# Patient Record
Sex: Male | Born: 1965 | Race: White | Hispanic: No | Marital: Married
Health system: Midwestern US, Community
[De-identification: ages and names within clinical notes are randomized; demographics above are authoritative.]

## PROBLEM LIST (undated history)

## (undated) DIAGNOSIS — I499 Cardiac arrhythmia, unspecified: Secondary | ICD-10-CM

## (undated) DIAGNOSIS — E7800 Pure hypercholesterolemia, unspecified: Secondary | ICD-10-CM

## (undated) DIAGNOSIS — M1990 Unspecified osteoarthritis, unspecified site: Secondary | ICD-10-CM

## (undated) DIAGNOSIS — I10 Essential (primary) hypertension: Secondary | ICD-10-CM

## (undated) DIAGNOSIS — R053 Chronic cough: Principal | ICD-10-CM

## (undated) DIAGNOSIS — Z6835 Body mass index (BMI) 35.0-35.9, adult: Secondary | ICD-10-CM

## (undated) DIAGNOSIS — E66812 Obesity, class 2: Principal | ICD-10-CM

## (undated) DIAGNOSIS — K581 Irritable bowel syndrome with constipation: Secondary | ICD-10-CM

## (undated) DIAGNOSIS — F4312 Post-traumatic stress disorder, chronic: Secondary | ICD-10-CM

## (undated) DIAGNOSIS — G4733 Obstructive sleep apnea (adult) (pediatric): Secondary | ICD-10-CM

## (undated) DIAGNOSIS — L309 Dermatitis, unspecified: Secondary | ICD-10-CM

## (undated) DIAGNOSIS — U071 COVID-19: Secondary | ICD-10-CM

## (undated) DIAGNOSIS — G478 Other sleep disorders: Secondary | ICD-10-CM

## (undated) DIAGNOSIS — J209 Acute bronchitis, unspecified: Principal | ICD-10-CM

## (undated) DIAGNOSIS — Z87891 Personal history of nicotine dependence: Secondary | ICD-10-CM

## (undated) DIAGNOSIS — N281 Cyst of kidney, acquired: Principal | ICD-10-CM

## (undated) DIAGNOSIS — Z716 Tobacco abuse counseling: Secondary | ICD-10-CM

## (undated) DIAGNOSIS — R110 Nausea: Secondary | ICD-10-CM

## (undated) DIAGNOSIS — Z23 Encounter for immunization: Secondary | ICD-10-CM

## (undated) DIAGNOSIS — L299 Pruritus, unspecified: Secondary | ICD-10-CM

## (undated) DIAGNOSIS — R058 Other specified cough: Secondary | ICD-10-CM

## (undated) DIAGNOSIS — J42 Unspecified chronic bronchitis: Secondary | ICD-10-CM

## (undated) HISTORY — PX: KNEE ARTHROSCOPY: SUR90

## (undated) HISTORY — PX: SHOULDER ARTHROSCOPY: SHX128

## (undated) HISTORY — PX: TONSILLECTOMY: SUR1361

---

## 2009-12-02 ENCOUNTER — Ambulatory Visit (HOSPITAL_COMMUNITY): Admission: RE | Admit: 2009-12-02 | Discharge: 2009-12-02 | Payer: Self-pay | Admitting: Orthopedic Surgery

## 2011-11-25 ENCOUNTER — Encounter (HOSPITAL_COMMUNITY): Payer: Self-pay | Admitting: Emergency Medicine

## 2011-11-25 ENCOUNTER — Emergency Department (HOSPITAL_COMMUNITY)
Admission: EM | Admit: 2011-11-25 | Discharge: 2011-11-25 | Disposition: A | Discharge status: Home / Self Care | Payer: Managed Care, Other (non HMO) | Attending: Emergency Medicine | Admitting: Emergency Medicine

## 2011-11-25 DIAGNOSIS — Z79899 Other long term (current) drug therapy: Secondary | ICD-10-CM | POA: Insufficient documentation

## 2011-11-25 DIAGNOSIS — F17200 Nicotine dependence, unspecified, uncomplicated: Secondary | ICD-10-CM | POA: Insufficient documentation

## 2011-11-25 DIAGNOSIS — R040 Epistaxis: Secondary | ICD-10-CM | POA: Insufficient documentation

## 2011-11-25 DIAGNOSIS — M129 Arthropathy, unspecified: Secondary | ICD-10-CM | POA: Insufficient documentation

## 2011-11-25 DIAGNOSIS — Z7982 Long term (current) use of aspirin: Secondary | ICD-10-CM | POA: Insufficient documentation

## 2011-11-25 DIAGNOSIS — E7800 Pure hypercholesterolemia, unspecified: Secondary | ICD-10-CM | POA: Insufficient documentation

## 2011-11-25 DIAGNOSIS — I10 Essential (primary) hypertension: Secondary | ICD-10-CM | POA: Insufficient documentation

## 2011-11-25 HISTORY — DX: Pure hypercholesterolemia, unspecified: E78.00

## 2011-11-25 HISTORY — DX: Essential (primary) hypertension: I10

## 2011-11-25 HISTORY — DX: Unspecified osteoarthritis, unspecified site: M19.90

## 2011-11-25 MED ORDER — OXYMETAZOLINE HCL 0.05 % NA SOLN
4.0000 | Freq: Once | NASAL | Status: AC
  Administered 2011-11-25: 4 via NASAL
  Filled 2011-11-25: qty 15

## 2011-11-25 NOTE — ED Provider Notes (Signed)
History     CSN: [id number]  Arrival date & time 11/25/11  1800   First MD Initiated Contact with Patient 11/25/11 1904      Chief Complaint  Patient presents with  . Epistaxis    (Consider location/radiation/quality/duration/timing/severity/associated sxs/prior treatment) HPI Comments: Phillip Daugherty is a 46 y.o. Male with left nares bleeding since yesterday. Also, has occasional bleeding from right nares, and coughing out blood. No weakness, dizziness, nausea, vomiting, fever, chills, sinus pain, or ear pain. There are no modifying factors. He is using his usual medicines, including Afrin nasal spray, without relief  Patient is a 46 y.o. male presenting with nosebleeds. The history is provided by the patient and the spouse.  Epistaxis     Past Medical History  Diagnosis Date  . Hypertension   . High cholesterol   . Arthritis     Past Surgical History  Procedure Date  . Tonsillectomy   . Shoulder arthroscopy   . Knee arthroscopy     No family history on file.  History  Substance Use Topics  . Smoking status: Current Every Day Smoker -- 1.0 packs/day    Types: Cigarettes  . Smokeless tobacco: Not on file  . Alcohol Use: No     rare      Review of Systems  HENT: Positive for nosebleeds.   All other systems reviewed and are negative.    Allergies  Review of patient's allergies indicates no known allergies.  Home Medications   Current Outpatient Rx  Name Route Sig Dispense Refill  . ASPIRIN EC 81 MG PO TBEC Oral Take 81 mg by mouth daily.    Marland Kitchen HYDROCOD POLST-CPM POLST ER 10-8 MG/5ML PO LQCR Oral Take 5 mLs by mouth 2 (two) times daily. Start 11/25/11 for 6 days    . CITALOPRAM HYDROBROMIDE 20 MG PO TABS Oral Take 20 mg by mouth daily.    Marland Kitchen FLUTICASONE PROPIONATE 50 MCG/ACT NA SUSP Nasal Place 2 sprays into the nose daily.    Marland Kitchen LEVOCETIRIZINE DIHYDROCHLORIDE 5 MG PO TABS Oral Take 5 mg by mouth daily.    Marland Kitchen LEVOFLOXACIN 500 MG PO TABS Oral Take 500 mg by  mouth daily. Start 11/25/11 for 10 days    . LISINOPRIL-HYDROCHLOROTHIAZIDE 20-12.5 MG PO TABS Oral Take 1 tablet by mouth daily.    Marland Kitchen MAGNESIUM OXIDE 400 MG PO TABS Oral Take 400 mg by mouth daily.    Marland Kitchen MONTELUKAST SODIUM 10 MG PO TABS Oral Take 10 mg by mouth at bedtime.    Marland Kitchen ONE-DAILY MULTI VITAMINS PO TABS Oral Take 1 tablet by mouth daily.    Marland Kitchen PANTOPRAZOLE SODIUM 40 MG PO TBEC Oral Take 40 mg by mouth daily.    Marland Kitchen SIMVASTATIN 40 MG PO TABS Oral Take 40 mg by mouth every evening.      BP 133/72  Pulse 104  Temp 98.6 F (37 C)  Resp 20  Ht 5\' 7"  (1.702 m)  Wt 260 lb (117.935 kg)  BMI 40.72 kg/m2  SpO2 99%  Physical Exam  Nursing note and vitals reviewed. Constitutional: He is oriented to person, place, and time. He appears well-developed and well-nourished.  HENT:  Head: Normocephalic and atraumatic.  Right Ear: External ear normal.  Left Ear: External ear normal.       Small amount of blood, left nares. No obvious active bleeding.  Eyes: Conjunctivae normal and EOM are normal. Pupils are equal, round, and reactive to light.  Neck: Normal range of motion  and phonation normal. Neck supple.  Cardiovascular: Normal rate.   Pulmonary/Chest: Effort normal. He exhibits no bony tenderness.  Abdominal: Normal appearance.  Musculoskeletal: Normal range of motion.  Neurological: He is alert and oriented to person, place, and time. He has normal strength. No cranial nerve deficit or sensory deficit. He exhibits normal muscle tone. Coordination normal.  Skin: Skin is warm, dry and intact.  Psychiatric: He has a normal mood and affect. His behavior is normal. Judgment and thought content normal.    ED Course  Procedures (including critical care time)  Emergency department treatment: Afrin nasal spray, and insertion of left nares, Rhino Rocket.  Procedure: no anesthesia. 7.5 CM posterior Rhino Rocket balloon inserted without difficulty. Balloon inflated to 11.5 cc. Patient tolerated  procedure well. No bleeding post placement of nasal balloon.  Nursing notes, applicable records and vitals reviewed.      Labs Reviewed - No data to display No results found.   1. Left-sided nosebleed       MDM  Epistaxis, cause unclear, but likely related to allergies with nasal mucosa irritation. He is hemodynamically stable. Doubt metabolic instability, serious bacterial infection or impending vascular collapse; the patient is stable for discharge.      Plan: Home Medications- usual; Home Treatments- ice to nose; Recommended follow up- ENT followup 4 days, sooner if needed    Flint Melter, MD 11/25/11 2025

## 2011-11-25 NOTE — ED Notes (Signed)
Pt states he does not currently take aspirin or any type of blood thinners, discussed measuring blood pressures and blood pressure medications.

## 2011-11-25 NOTE — ED Notes (Signed)
Epistasis appears controlled, pt states he no longer feels liquid running down his throat and is not coughing up any blood or clots.  Pt instructed to return to the ER if the bleeding returns.

## 2011-11-25 NOTE — ED Notes (Signed)
Patient states he has been having a left sided nose bleed since yesterday.  States he can taste blood in his mouth and has coughed up several clots.  States he was seen by a physician today and was told to come to the ER to be evaluated.  Pt states he has a history of hypertension, however is controlled with medications.

## 2011-11-25 NOTE — ED Notes (Addendum)
Pt reports intermittent nosebleed since yesterday morning. Pt states bleeding is from left nare and it has been running down throat, therefore causing him to cough up large clots of blood at times. Pt was seen at [facility] in [place] earlier today and dx with bronchitis but was told they could do nothing about the nosebleed. Pt has been packing his nose with cotton balls saturated in Afrin with no relief.

## 2011-11-25 NOTE — ED Notes (Signed)
Pt c/o intermittent nosebleed with clots since yesterday am. Denies dizziness. Pt seen at [facility] earlier today and dx with bronchitis and placed antibiotics.

## 2011-11-27 ENCOUNTER — Encounter (HOSPITAL_COMMUNITY): Payer: Self-pay | Admitting: Emergency Medicine

## 2011-11-27 ENCOUNTER — Emergency Department (HOSPITAL_COMMUNITY)
Admission: EM | Admit: 2011-11-27 | Discharge: 2011-11-27 | Disposition: A | Discharge status: Home / Self Care | Payer: Managed Care, Other (non HMO) | Attending: Emergency Medicine | Admitting: Emergency Medicine

## 2011-11-27 DIAGNOSIS — E7800 Pure hypercholesterolemia, unspecified: Secondary | ICD-10-CM | POA: Insufficient documentation

## 2011-11-27 DIAGNOSIS — R040 Epistaxis: Secondary | ICD-10-CM

## 2011-11-27 DIAGNOSIS — Z79899 Other long term (current) drug therapy: Secondary | ICD-10-CM | POA: Insufficient documentation

## 2011-11-27 DIAGNOSIS — F17200 Nicotine dependence, unspecified, uncomplicated: Secondary | ICD-10-CM | POA: Insufficient documentation

## 2011-11-27 DIAGNOSIS — Z7982 Long term (current) use of aspirin: Secondary | ICD-10-CM | POA: Insufficient documentation

## 2011-11-27 DIAGNOSIS — Z8739 Personal history of other diseases of the musculoskeletal system and connective tissue: Secondary | ICD-10-CM | POA: Insufficient documentation

## 2011-11-27 DIAGNOSIS — I10 Essential (primary) hypertension: Secondary | ICD-10-CM | POA: Insufficient documentation

## 2011-11-27 NOTE — ED Notes (Signed)
Pt reports had rhino rocket placed SUnday.  Reports woke up this morning and it had slid back further in his nose causing him to gag.  Notified Dr. Adriana Simas.  OK per Dr. Adriana Simas to deflate and reposition the rhino rocket.  Pt says is much more comfortable.

## 2011-11-27 NOTE — ED Notes (Signed)
Pt was seen here on Sunday for a nose bleed. Has a rapid rhino in place to the L nare and he states he feels like it has backed up ant it is now gagging him. Pt denies any bleeding. Front/inflation area is still in place and taped to face.

## 2011-11-29 ENCOUNTER — Ambulatory Visit (INDEPENDENT_AMBULATORY_CARE_PROVIDER_SITE_OTHER): Payer: Managed Care, Other (non HMO) | Admitting: Otolaryngology

## 2011-11-29 DIAGNOSIS — R040 Epistaxis: Secondary | ICD-10-CM

## 2011-12-04 NOTE — ED Provider Notes (Signed)
History     CSN: [id number]  Arrival date & time 11/27/11  1027   First MD Initiated Contact with Patient 11/27/11 0745      Chief Complaint  Patient presents with  . Follow-up  . Epistaxis    (Consider location/radiation/quality/duration/timing/severity/associated sxs/prior treatment) HPI....status post insertion of rapid Rhino past Sunday for left-sided nosebleed.  Patient feels it is too far back in his nose and is gagging him.  No airway problems. Severity is mild to moderate. Symptoms started today   Past Medical History  Diagnosis Date  . Hypertension   . High cholesterol   . Arthritis     Past Surgical History  Procedure Date  . Tonsillectomy   . Shoulder arthroscopy   . Knee arthroscopy     History reviewed. No pertinent family history.  History  Substance Use Topics  . Smoking status: Current Every Day Smoker -- 1.0 packs/day    Types: Cigarettes  . Smokeless tobacco: Not on file  . Alcohol Use: No     Comment: rare      Review of Systems  All other systems reviewed and are negative.    Allergies  Review of patient's allergies indicates no known allergies.  Home Medications   Current Outpatient Rx  Name  Route  Sig  Dispense  Refill  . ASPIRIN EC 81 MG PO TBEC   Oral   Take 81 mg by mouth daily.         Marland Kitchen HYDROCOD POLST-CPM POLST ER 10-8 MG/5ML PO LQCR   Oral   Take 5 mLs by mouth 2 (two) times daily. Start 11/25/11 for 6 days         . CITALOPRAM HYDROBROMIDE 20 MG PO TABS   Oral   Take 20 mg by mouth daily.         Marland Kitchen FLUTICASONE PROPIONATE 50 MCG/ACT NA SUSP   Nasal   Place 2 sprays into the nose daily.         Marland Kitchen LEVOCETIRIZINE DIHYDROCHLORIDE 5 MG PO TABS   Oral   Take 5 mg by mouth daily.         Marland Kitchen LEVOFLOXACIN 500 MG PO TABS   Oral   Take 500 mg by mouth daily. Start 11/25/11 for 10 days         . LISINOPRIL-HYDROCHLOROTHIAZIDE 20-12.5 MG PO TABS   Oral   Take 1 tablet by mouth daily.         Marland Kitchen MAGNESIUM  OXIDE 400 MG PO TABS   Oral   Take 400 mg by mouth daily.         Marland Kitchen MONTELUKAST SODIUM 10 MG PO TABS   Oral   Take 10 mg by mouth at bedtime.         Marland Kitchen ONE-DAILY MULTI VITAMINS PO TABS   Oral   Take 1 tablet by mouth daily.         Marland Kitchen PANTOPRAZOLE SODIUM 40 MG PO TBEC   Oral   Take 40 mg by mouth daily.         Marland Kitchen SIMVASTATIN 40 MG PO TABS   Oral   Take 40 mg by mouth every evening.           BP 127/93  Pulse 85  Temp 98.5 F (36.9 C) (Oral)  Resp 18  Ht 5\' 7"  (1.702 m)  Wt 260 lb (117.935 kg)  BMI 40.72 kg/m2  SpO2 100%  Physical Exam  Constitutional: He is oriented to  person, place, and time. He appears well-developed and well-nourished.  HENT:  Head: Normocephalic and atraumatic.       Rapid Rhino deflated and repositioned anteriorly.  Balloon then reinflated.  Eyes: Conjunctivae normal are normal.  Neck: Normal range of motion. Neck supple.  Musculoskeletal: Normal range of motion.  Neurological: He is alert and oriented to person, place, and time.  Skin: Skin is warm and dry.  Psychiatric: He has a normal mood and affect.    ED Course  Procedures (including critical care time)  Labs Reviewed - No data to display No results found.   1. Epistaxis       MDM  Patient feels much better after repositioning of the nasal packing. No airway compromise. Will followup with otolaryngologist        Donnetta Hutching, MD 12/04/11 1819

## 2012-01-25 IMAGING — CR DG ORBITS FOR FOREIGN BODY
2 series · 2 of 2 positions shown · non-contrast
Comparison: None.

CLINICAL DATA: Pre MRI.  History of working with metal.

ORBITS FOR FOREIGN BODY - 2 VIEW

[w waters (1 of 2)]
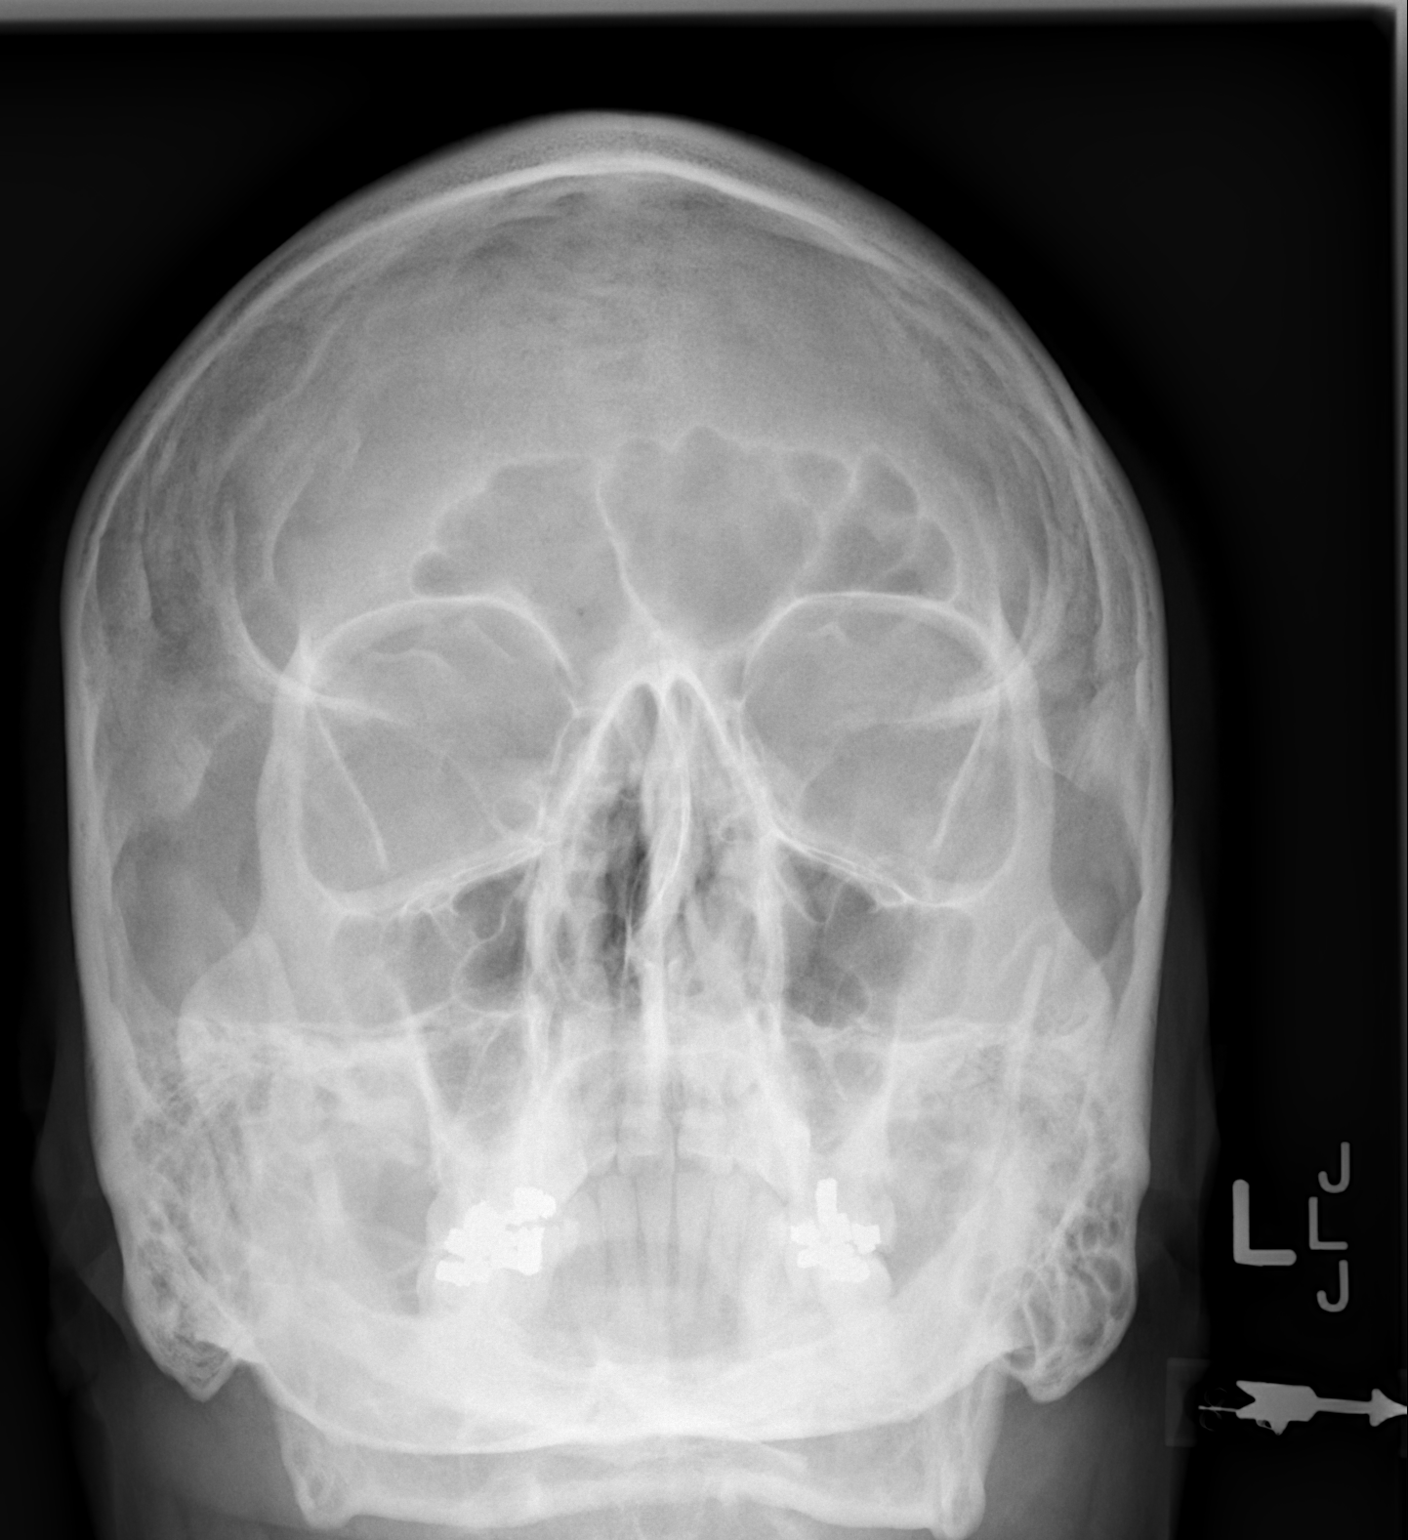

[w waters (2 of 2)]
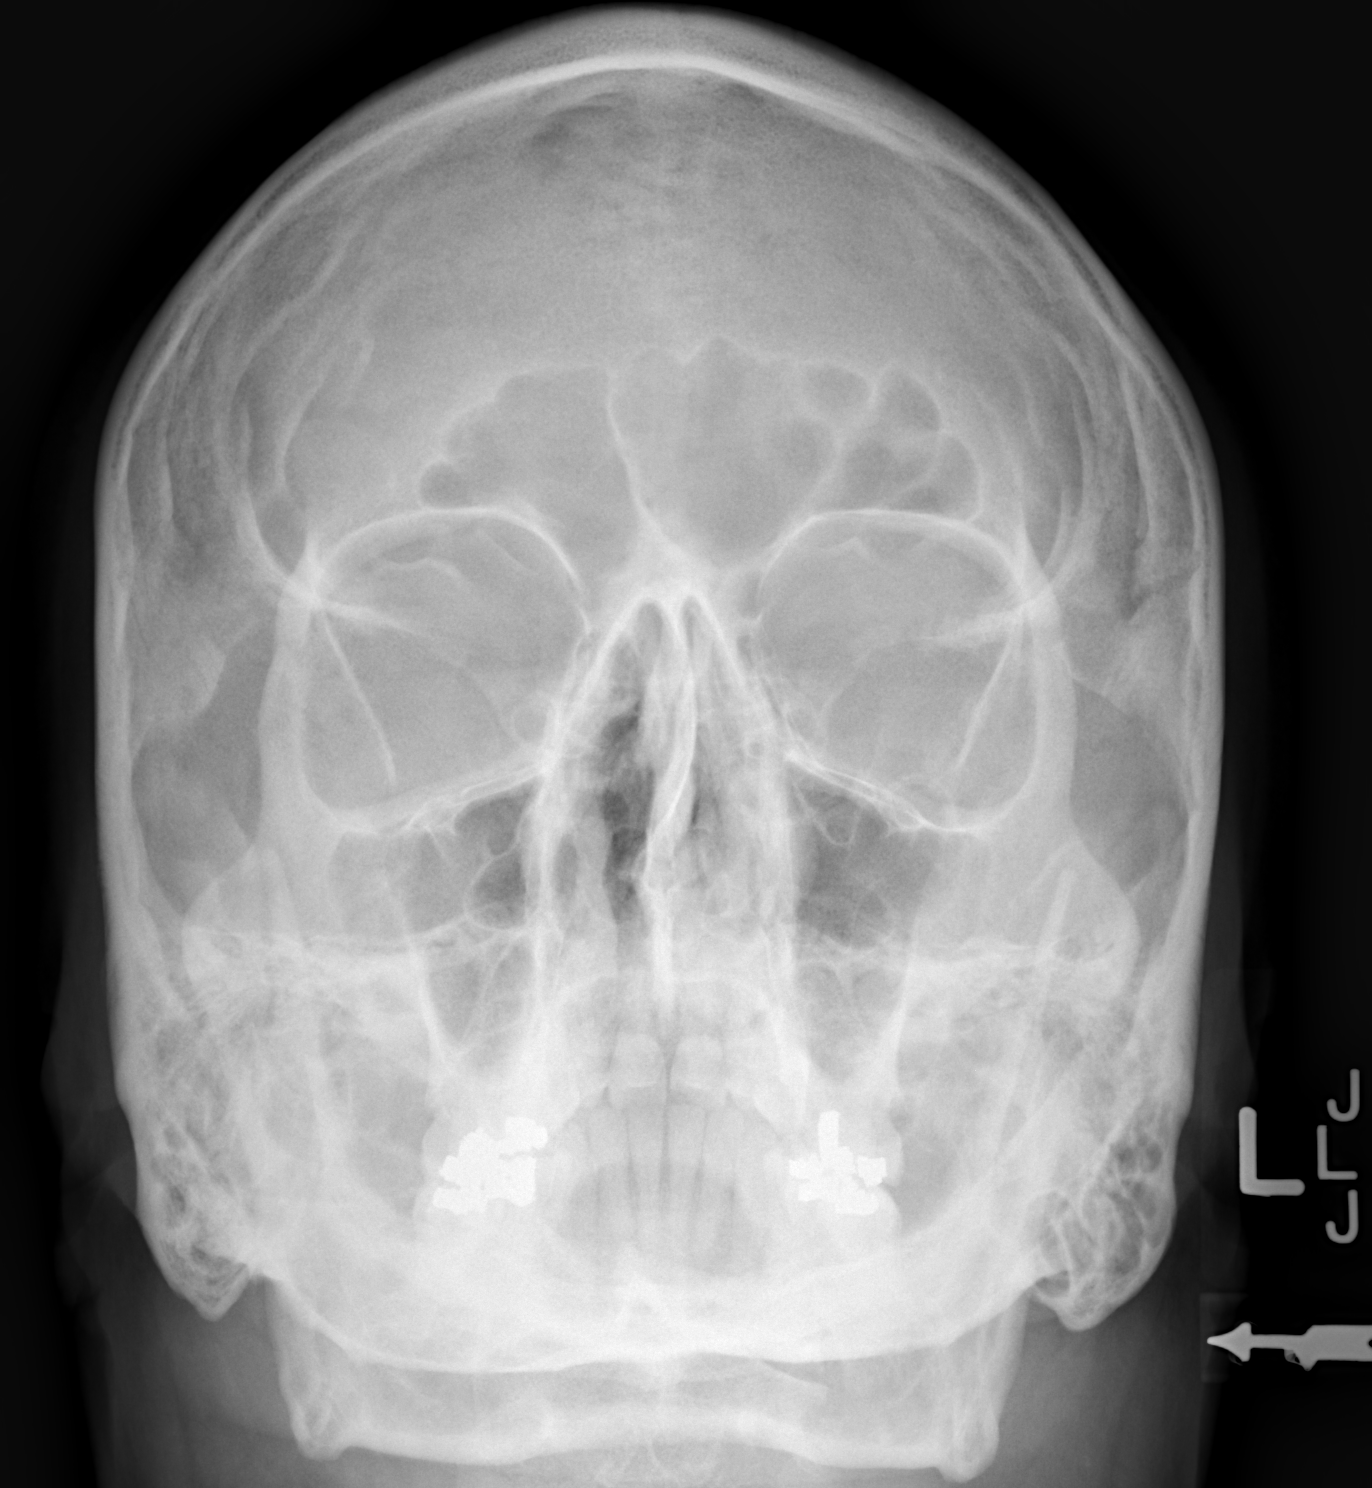

[2 of 2 positions shown; findings below may reference images not displayed]

FINDINGS: Two views of the orbits demonstrate no radiopaque foreign
bodies.  The visualized paranasal sinuses appear clear.  There may
be some fluid in the right mastoid air cells.
IMPRESSION: 1.  No radiopaque foreign body projects over either orbit.
2.  Question fluid within the right mastoid air cells.

## 2012-12-19 ENCOUNTER — Encounter (HOSPITAL_COMMUNITY): Payer: Self-pay | Admitting: Pharmacy Technician

## 2012-12-21 NOTE — H&P (Signed)
  NTS SOAP Note  Vital Signs:  Vitals as of: 12/18/2012: Systolic 130: Diastolic 79: Heart Rate 86: Temp 98.75F: Height 51ft 7in: Weight 238Lbs 0 Ounces: Pain Level 3: BMI 37.28  BMI : 37.28 kg/m2  Subjective: This 47 Years 78 Months old Male presents for of    HERNIA: ,Has had an enlarging umbilical hernia over the past few months.  Has started having pain with straining, having to reduce it himself.  Review of Symptoms:  Constitutional:unremarkable   Head:unremarkable    Eyes:  pain bilateral Nose/Mouth/Throat:unremarkable Cardiovascular:  unremarkable   Respiratory:unremarkable   Gastrointestin    abdominal pain Genitourinary:unremarkable       joint and back Skin:unremarkable Hematolgic/Lymphatic:unremarkable     Allergic/Immunologic:unremarkable     Past Medical History:    Reviewed   Past Medical History  Surgical History: left shoulder repair, left knee surgery Medical Problems: HTN, reflux, extrinsic asthma Allergies: nkda Medications: lisinopril, simvastatin, xyzal, protonix, lortab, singulair, soma, baby asa, aleve prn   Social History:Reviewed  Social History  Preferred Language: English Race:  White Ethnicity: Not Hispanic / Latino Age: 47 Years 7 Months Marital Status:  M Alcohol: rarely Recreational drug(s):  No   Smoking Status: Former smoker reviewed on 12/18/2012 Started Date: 01/30/1983 Stopped Date: 01/29/2010 Functional Status reviewed on mm/dd/yyyy ------------------------------------------------ Bathing: Normal Cooking: Normal Dressing: Normal Driving: Normal Eating: Normal Managing Meds: Normal Oral Care: Normal Shopping: Normal Toileting: Normal Transferring: Normal Walking: Normal Cognitive Status reviewed on mm/dd/yyyy ------------------------------------------------ Attention: Normal Decision Making: Normal Language: Normal Memory: Normal Motor: Normal Perception: Normal Problem  Solving: Normal Visual and Spatial: Normal   Family History:  Reviewed  Family Health History Mother, Deceased; Breast cancer;  Father, Living; Colon cancer;     Objective Information: General:  Well appearing, well nourished in no distress. Heart:  RRR, no murmur Lungs:    CTA bilaterally, no wheezes, rhonchi, rales.  Breathing unlabored. Abdomen:Soft, NT/ND, no HSM, no masses.  Reducible umbilical hernia present.  Assessment:Umbilical hernia  Diagnosis &amp; Procedure Smart Code   Plan:Will call to scheduled umbilical herniorrhaphy with mesh.   Patient Education:Alternative treatments to surgery were discussed with patient (and family).  Risks and benefits  of procedure bleeding, infection, and recurrence were fully explained to the patient (and family) who gave informed consent. Patient/family questions were addressed.  Follow-up:Pending Surgery

## 2012-12-23 ENCOUNTER — Other Ambulatory Visit: Payer: Self-pay

## 2012-12-23 ENCOUNTER — Encounter (HOSPITAL_COMMUNITY)
Admission: RE | Admit: 2012-12-23 | Discharge: 2012-12-23 | Disposition: A | Discharge status: Home / Self Care | Payer: Managed Care, Other (non HMO) | Source: Ambulatory Visit | Attending: General Surgery | Admitting: General Surgery

## 2012-12-23 ENCOUNTER — Encounter (HOSPITAL_COMMUNITY): Payer: Self-pay

## 2012-12-23 DIAGNOSIS — Z01818 Encounter for other preprocedural examination: Secondary | ICD-10-CM | POA: Insufficient documentation

## 2012-12-23 DIAGNOSIS — Z01812 Encounter for preprocedural laboratory examination: Secondary | ICD-10-CM | POA: Insufficient documentation

## 2012-12-23 DIAGNOSIS — Z01810 Encounter for preprocedural cardiovascular examination: Secondary | ICD-10-CM | POA: Insufficient documentation

## 2012-12-23 LAB — BASIC METABOLIC PANEL
BUN: 21 mg/dL (ref 6–23)
Creatinine, Ser: 1.02 mg/dL (ref 0.50–1.35)
GFR calc Af Amer: >90 mL/min (ref >90)
GFR calc non Af Amer: 85 mL/min — ABNORMAL LOW (ref >90)
Glucose, Bld: 116 mg/dL — ABNORMAL HIGH (ref 70–99)
Potassium: 4.9 mEq/L (ref 3.5–5.1)

## 2012-12-23 LAB — CBC WITH DIFFERENTIAL/PLATELET
Basophils Absolute: 0.1 10*3/uL (ref 0.0–0.1)
Basophils Relative: 1 % (ref 0–1)
Eosinophils Absolute: 0.2 10*3/uL (ref 0.0–0.7)
Hemoglobin: 13.5 g/dL (ref 13.0–17.0)
MCH: 30.5 pg (ref 26.0–34.0)
MCHC: 33.3 g/dL (ref 30.0–36.0)
Monocytes Absolute: 0.7 10*3/uL (ref 0.1–1.0)
Monocytes Relative: 7 % (ref 3–12)
Neutrophils Relative %: 55 % (ref 43–77)
RDW: 12.9 % (ref 11.5–15.5)

## 2012-12-23 NOTE — Patient Instructions (Signed)
Phillip Daugherty  12/23/2012   Your procedure is scheduled on:  12/29/2012  Report to [facility] at  615  AM.  Call this number if you have problems the morning of surgery: [phone]   Remember:   Do not eat food or drink liquids after midnight.   Take these medicines the morning of surgery with A SIP OF WATER:  Celexa, xyzal, lisinopril, prilosec   Do not wear jewelry, make-up or nail polish.  Do not wear lotions, powders, or perfumes.   Do not shave 48 hours prior to surgery. Men may shave face and neck.  Do not bring valuables to the hospital.  [facility] is not responsible for any belongings or valuables.               Contacts, dentures or bridgework may not be worn into surgery.  Leave suitcase in the car. After surgery it may be brought to your room.  For patients admitted to the hospital, discharge time is determined by your  treatment team.               Patients discharged the day of surgery will not be allowed to drive home.  Name and phone number of your driver: family  Special Instructions: Shower using CHG 2 nights before surgery and the night before surgery.  If you shower the day of surgery use CHG.  Use special wash - you have one bottle of CHG for all showers.  You should use approximately 1/3 of the bottle for each shower.   Please read over the following fact sheets that you were given: Pain Booklet, Coughing and Deep Breathing, MRSA Information, Surgical Site Infection Prevention, Anesthesia Post-op Instructions and Care and Recovery After Surgery Hernia A hernia happens when an organ inside your body pushes out through a weak spot in your belly (abdominal) wall. Most hernias get worse over time. They can often be pushed back into place (reduced). Surgery may be needed to repair hernias that cannot be pushed into place. HOME CARE  Keep doing normal activities.  Avoid lifting more than 10 pounds (4.5 kilograms).  Cough gently and avoid straining. Over time,  these things will:  Increase your hernia size.  Irritate your hernia.  Break down hernia repairs.  Stop smoking.  Do not wear anything tight over your hernia. Do not keep the hernia in with an outside bandage.  Eat food that is high in fiber (fruit, vegetables, whole grains).  Drink enough fluids to keep your pee (urine) clear or pale yellow.  Take medicines to make your poop soft (stool softeners) if you cannot poop (constipated). GET HELP RIGHT AWAY IF:   You have a fever.  You have belly pain that gets worse.  You feel sick to your stomach (nauseous) and throw up (vomit).  Your skin starts to bulge out.  Your hernia turns a different color, feels hard, or is tender.  You have increased pain or puffiness (swelling) around the hernia.  You poop more or less often.  Your poop does not look the way normally does.  You have watery poop (diarrhea).  You cannot push the hernia back in place by applying gentle pressure while lying down. MAKE SURE YOU:   Understand these instructions.  Will watch your condition.  Will get help right away if you are not doing well or get worse. Document Released: 07/05/2009 Document Revised: 04/09/2011 Document Reviewed: 07/05/2009 [facility] Patient Information 2014 [place], [place]. PATIENT INSTRUCTIONS  POST-ANESTHESIA  IMMEDIATELY FOLLOWING SURGERY:  Do not drive or operate machinery for the first twenty four hours after surgery.  Do not make any important decisions for twenty four hours after surgery or while taking narcotic pain medications or sedatives.  If you develop intractable nausea and vomiting or a severe headache please notify your doctor immediately.  FOLLOW-UP:  Please make an appointment with your surgeon as instructed. You do not need to follow up with anesthesia unless specifically instructed to do so.  WOUND CARE INSTRUCTIONS (if applicable):  Keep a dry clean dressing on the anesthesia/puncture wound site if there is  drainage.  Once the wound has quit draining you may leave it open to air.  Generally you should leave the bandage intact for twenty four hours unless there is drainage.  If the epidural site drains for more than 36-48 hours please call the anesthesia department.  QUESTIONS?:  Please feel free to call your physician or the hospital operator if you have any questions, and they will be happy to assist you.

## 2012-12-29 ENCOUNTER — Ambulatory Visit (HOSPITAL_COMMUNITY): Payer: Managed Care, Other (non HMO) | Admitting: Anesthesiology

## 2012-12-29 ENCOUNTER — Encounter (HOSPITAL_COMMUNITY): Payer: Self-pay | Admitting: *Deleted

## 2012-12-29 ENCOUNTER — Encounter (HOSPITAL_COMMUNITY): Payer: Managed Care, Other (non HMO) | Admitting: Anesthesiology

## 2012-12-29 ENCOUNTER — Ambulatory Visit (HOSPITAL_COMMUNITY)
Admission: RE | Admit: 2012-12-29 | Discharge: 2012-12-29 | Disposition: A | Discharge status: Home / Self Care | Payer: Managed Care, Other (non HMO) | Source: Ambulatory Visit | Attending: General Surgery | Admitting: General Surgery

## 2012-12-29 ENCOUNTER — Encounter (HOSPITAL_COMMUNITY)
Admission: RE | Disposition: A | Discharge status: Home / Self Care | Payer: Self-pay | Source: Ambulatory Visit | Attending: General Surgery

## 2012-12-29 DIAGNOSIS — K429 Umbilical hernia without obstruction or gangrene: Secondary | ICD-10-CM | POA: Insufficient documentation

## 2012-12-29 DIAGNOSIS — I10 Essential (primary) hypertension: Secondary | ICD-10-CM | POA: Insufficient documentation

## 2012-12-29 HISTORY — PX: INSERTION OF MESH: SHX5868

## 2012-12-29 HISTORY — PX: UMBILICAL HERNIA REPAIR: SHX196

## 2012-12-29 SURGERY — REPAIR, HERNIA, UMBILICAL, ADULT
Anesthesia: General | Site: Abdomen | Wound class: Clean

## 2012-12-29 MED ORDER — SEVOFLURANE IN SOLN
RESPIRATORY_TRACT | Status: AC
  Filled 2012-12-29: qty 250

## 2012-12-29 MED ORDER — NEOSTIGMINE METHYLSULFATE 1 MG/ML IJ SOLN
INTRAMUSCULAR | Status: DC | PRN
  Administered 2012-12-29: 2 mg via INTRAVENOUS
  Administered 2012-12-29: 1 mg via INTRAVENOUS

## 2012-12-29 MED ORDER — NEOSTIGMINE METHYLSULFATE 1 MG/ML IJ SOLN
INTRAMUSCULAR | Status: AC
  Filled 2012-12-29: qty 1

## 2012-12-29 MED ORDER — 0.9 % SODIUM CHLORIDE (POUR BTL) OPTIME
TOPICAL | Status: DC | PRN
  Administered 2012-12-29: 1000 mL

## 2012-12-29 MED ORDER — DEXAMETHASONE SODIUM PHOSPHATE 4 MG/ML IJ SOLN
4.0000 mg | Freq: Once | INTRAMUSCULAR | Status: AC
  Administered 2012-12-29: 4 mg via INTRAVENOUS

## 2012-12-29 MED ORDER — GLYCOPYRROLATE 0.2 MG/ML IJ SOLN
0.2000 mg | Freq: Once | INTRAMUSCULAR | Status: AC
  Administered 2012-12-29: 0.2 mg via INTRAVENOUS

## 2012-12-29 MED ORDER — KETOROLAC TROMETHAMINE 30 MG/ML IJ SOLN
INTRAMUSCULAR | Status: AC
  Filled 2012-12-29: qty 1

## 2012-12-29 MED ORDER — FENTANYL CITRATE 0.05 MG/ML IJ SOLN
25.0000 ug | INTRAMUSCULAR | Status: DC | PRN
  Administered 2012-12-29 (×2): 50 ug via INTRAVENOUS

## 2012-12-29 MED ORDER — MIDAZOLAM HCL 2 MG/2ML IJ SOLN
INTRAMUSCULAR | Status: AC
  Filled 2012-12-29: qty 2

## 2012-12-29 MED ORDER — BUPIVACAINE HCL (PF) 0.5 % IJ SOLN
INTRAMUSCULAR | Status: DC | PRN
  Administered 2012-12-29: 7 mL

## 2012-12-29 MED ORDER — SUCCINYLCHOLINE CHLORIDE 20 MG/ML IJ SOLN
INTRAMUSCULAR | Status: DC | PRN
  Administered 2012-12-29: 150 mg via INTRAVENOUS

## 2012-12-29 MED ORDER — MIDAZOLAM HCL 5 MG/5ML IJ SOLN
INTRAMUSCULAR | Status: DC | PRN
  Administered 2012-12-29: 2 mg via INTRAVENOUS

## 2012-12-29 MED ORDER — MIDAZOLAM HCL 2 MG/2ML IJ SOLN
1.0000 mg | INTRAMUSCULAR | Status: DC | PRN
  Administered 2012-12-29: 2 mg via INTRAVENOUS

## 2012-12-29 MED ORDER — LIDOCAINE HCL (PF) 1 % IJ SOLN
INTRAMUSCULAR | Status: AC
  Filled 2012-12-29: qty 5

## 2012-12-29 MED ORDER — OXYCODONE-ACETAMINOPHEN 7.5-325 MG PO TABS: 1.0000 | ORAL_TABLET | ORAL | Status: DC | PRN

## 2012-12-29 MED ORDER — ONDANSETRON HCL 4 MG/2ML IJ SOLN
4.0000 mg | Freq: Once | INTRAMUSCULAR | Status: AC
  Administered 2012-12-29: 4 mg via INTRAVENOUS

## 2012-12-29 MED ORDER — KETOROLAC TROMETHAMINE 30 MG/ML IJ SOLN
30.0000 mg | Freq: Once | INTRAMUSCULAR | Status: AC
  Administered 2012-12-29: 30 mg via INTRAVENOUS

## 2012-12-29 MED ORDER — DEXAMETHASONE SODIUM PHOSPHATE 4 MG/ML IJ SOLN
INTRAMUSCULAR | Status: AC
  Filled 2012-12-29: qty 1

## 2012-12-29 MED ORDER — GLYCOPYRROLATE 0.2 MG/ML IJ SOLN
INTRAMUSCULAR | Status: DC | PRN
  Administered 2012-12-29 (×2): 0.2 mg via INTRAVENOUS

## 2012-12-29 MED ORDER — LACTATED RINGERS IV SOLN
INTRAVENOUS | Status: DC
  Administered 2012-12-29: 1000 mL via INTRAVENOUS

## 2012-12-29 MED ORDER — FENTANYL CITRATE 0.05 MG/ML IJ SOLN
INTRAMUSCULAR | Status: AC
  Filled 2012-12-29: qty 5

## 2012-12-29 MED ORDER — ROCURONIUM BROMIDE 50 MG/5ML IV SOLN
INTRAVENOUS | Status: AC
  Filled 2012-12-29: qty 1

## 2012-12-29 MED ORDER — ROCURONIUM BROMIDE 100 MG/10ML IV SOLN
INTRAVENOUS | Status: DC | PRN
  Administered 2012-12-29: 10 mg via INTRAVENOUS
  Administered 2012-12-29: 30 mg via INTRAVENOUS

## 2012-12-29 MED ORDER — GLYCOPYRROLATE 0.2 MG/ML IJ SOLN
INTRAMUSCULAR | Status: AC
  Filled 2012-12-29: qty 1

## 2012-12-29 MED ORDER — ONDANSETRON HCL 4 MG/2ML IJ SOLN
INTRAMUSCULAR | Status: AC
  Filled 2012-12-29: qty 2

## 2012-12-29 MED ORDER — BUPIVACAINE HCL (PF) 0.5 % IJ SOLN
INTRAMUSCULAR | Status: AC
  Filled 2012-12-29: qty 30

## 2012-12-29 MED ORDER — FENTANYL CITRATE 0.05 MG/ML IJ SOLN
INTRAMUSCULAR | Status: AC
  Filled 2012-12-29: qty 2

## 2012-12-29 MED ORDER — GLYCOPYRROLATE 0.2 MG/ML IJ SOLN
INTRAMUSCULAR | Status: AC
  Filled 2012-12-29: qty 2

## 2012-12-29 MED ORDER — POVIDONE-IODINE 10 % EX OINT
TOPICAL_OINTMENT | CUTANEOUS | Status: AC
  Filled 2012-12-29: qty 1

## 2012-12-29 MED ORDER — PROPOFOL 10 MG/ML IV BOLUS
INTRAVENOUS | Status: AC
Start: 2012-12-29 — End: 2012-12-29
  Filled 2012-12-29: qty 20

## 2012-12-29 MED ORDER — SUCCINYLCHOLINE CHLORIDE 20 MG/ML IJ SOLN
INTRAMUSCULAR | Status: AC
  Filled 2012-12-29: qty 1

## 2012-12-29 MED ORDER — PROPOFOL 10 MG/ML IV BOLUS
INTRAVENOUS | Status: DC | PRN
  Administered 2012-12-29: 160 mg via INTRAVENOUS

## 2012-12-29 MED ORDER — CEFAZOLIN SODIUM-DEXTROSE 2-3 GM-% IV SOLR
2.0000 g | INTRAVENOUS | Status: AC
  Administered 2012-12-29: 2 g via INTRAVENOUS
  Filled 2012-12-29: qty 50

## 2012-12-29 MED ORDER — FENTANYL CITRATE 0.05 MG/ML IJ SOLN
INTRAMUSCULAR | Status: DC | PRN
  Administered 2012-12-29: 100 ug via INTRAVENOUS
  Administered 2012-12-29: 50 ug via INTRAVENOUS
  Administered 2012-12-29: 100 ug via INTRAVENOUS

## 2012-12-29 MED ORDER — CHLORHEXIDINE GLUCONATE 4 % EX LIQD: 1.0000 "application " | Freq: Once | CUTANEOUS | Status: DC

## 2012-12-29 MED ORDER — LIDOCAINE HCL 1 % IJ SOLN
INTRAMUSCULAR | Status: DC | PRN
  Administered 2012-12-29: 50 mg via INTRADERMAL

## 2012-12-29 MED ORDER — ONDANSETRON HCL 4 MG/2ML IJ SOLN: 4.0000 mg | Freq: Once | INTRAMUSCULAR | Status: DC | PRN

## 2012-12-29 MED ORDER — POVIDONE-IODINE 10 % OINT PACKET
TOPICAL_OINTMENT | CUTANEOUS | Status: DC | PRN
  Administered 2012-12-29: 1 via TOPICAL

## 2012-12-29 SURGICAL SUPPLY — 34 items
BAG HAMPER (MISCELLANEOUS) ×2 IMPLANT
BLADE SURG SZ11 CARB STEEL (BLADE) ×2 IMPLANT
CLOTH BEACON ORANGE TIMEOUT ST (SAFETY) ×2 IMPLANT
COVER LIGHT HANDLE STERIS (MISCELLANEOUS) ×4 IMPLANT
DECANTER SPIKE VIAL GLASS SM (MISCELLANEOUS) ×2 IMPLANT
DURAPREP 26ML APPLICATOR (WOUND CARE) ×2 IMPLANT
ELECT REM PT RETURN 9FT ADLT (ELECTROSURGICAL) ×2
ELECTRODE REM PT RTRN 9FT ADLT (ELECTROSURGICAL) ×1 IMPLANT
GLOVE BIOGEL PI IND STRL 8 (GLOVE) ×1 IMPLANT
GLOVE BIOGEL PI INDICATOR 8 (GLOVE) ×1
GLOVE ECLIPSE 6.5 STRL STRAW (GLOVE) ×2 IMPLANT
GLOVE ECLIPSE 7.5 STRL STRAW (GLOVE) ×2 IMPLANT
GLOVE INDICATOR 7.0 STRL GRN (GLOVE) ×4 IMPLANT
GLOVE SS BIOGEL STRL SZ 6.5 (GLOVE) ×1 IMPLANT
GLOVE SUPERSENSE BIOGEL SZ 6.5 (GLOVE) ×1
GOWN STRL REIN XL XLG (GOWN DISPOSABLE) ×6 IMPLANT
INST SET MINOR GENERAL (KITS) ×2 IMPLANT
KIT ROOM TURNOVER APOR (KITS) ×2 IMPLANT
MANIFOLD NEPTUNE II (INSTRUMENTS) ×2 IMPLANT
NEEDLE HYPO 25X1 1.5 SAFETY (NEEDLE) ×2 IMPLANT
NS IRRIG 1000ML POUR BTL (IV SOLUTION) ×2 IMPLANT
PACK MINOR (CUSTOM PROCEDURE TRAY) ×2 IMPLANT
PAD ARMBOARD 7.5X6 YLW CONV (MISCELLANEOUS) ×2 IMPLANT
PATCH VENTRAL SMALL 4.3 (Mesh Specialty) ×2 IMPLANT
SET BASIN LINEN APH (SET/KITS/TRAYS/PACK) ×2 IMPLANT
SPONGE GAUZE 2X2 8PLY STRL LF (GAUZE/BANDAGES/DRESSINGS) ×4 IMPLANT
STAPLER VISISTAT (STAPLE) ×2 IMPLANT
SUT ETHIBOND NAB MO 7 #0 18IN (SUTURE) ×2 IMPLANT
SUT VIC AB 2-0 CT2 27 (SUTURE) ×2 IMPLANT
SUT VIC AB 3-0 SH 27 (SUTURE) ×1
SUT VIC AB 3-0 SH 27X BRD (SUTURE) ×1 IMPLANT
SUT VICRYL AB 3 0 TIES (SUTURE) IMPLANT
SYR CONTROL 10ML LL (SYRINGE) ×2 IMPLANT
TAPE CLOTH SURG 4X10 WHT LF (GAUZE/BANDAGES/DRESSINGS) ×2 IMPLANT

## 2012-12-29 NOTE — Op Note (Signed)
Patient:  Phillip Daugherty  DOB:  18-Jan-1966  MRN:  [id number]   Preop Diagnosis:  Umbilical hernia  Postop Diagnosis:  Same  Procedure:  Umbilical herniorrhaphy with mesh  Surgeon:  Franky Macho, M.D.  Anes:  General  Indications:  Patient is a 47 year old white male who presents with an umbilical hernia. The risks and benefits of the procedure including bleeding, infection, and recurrence of the hernia were fully explained to the patient, who gave informed consent.  Procedure note:  The patient was placed in the supine position. After general anesthesia was administered, the abdomen was prepped and draped using usual sterile technique with DuraPrep. Surgical site confirmation was performed.  An infraumbilical incision was made down to the fascia. The umbilicus was freed away from the underlying hernia sac. A small amount of omentum was noted in the hernia sac and this was reduced. Excess hernia sac down to the fascia was excised sharply. It was disposed of. A 4.2 cm proceed mesh patch was then inserted and secured to the fascia using 0 Ethibond interrupted sutures. The overlying fascia was reapproximated transversely using 0 Ethibond interrupted sutures. The base the umbilicus was secured back to the fascia using a 2-0 Vicryl interrupted suture. The subcutaneous layer was reapproximated using 3-0 Vicryl interrupted suture. The skin was closed using staples. 0.5% Sensorcaine was instilled into the surrounding wound. Betadine ointment and a dry sterile dressing were applied.  All tape and needle counts were correct at the end of the procedure. Patient was awakened and transferred to PACU in stable condition.  Complications:  None  EBL:  Minimal  Specimen:  None

## 2012-12-29 NOTE — Anesthesia Postprocedure Evaluation (Signed)
  Anesthesia Post-op Note  Patient: Phillip Daugherty  Procedure(s) Performed: Procedure(s): HERNIA REPAIR UMBILICAL ADULT (N/A) INSERTION OF MESH (N/A)  Patient Location: PACU  Anesthesia Type:General  Level of Consciousness: awake, alert , oriented and patient cooperative  Airway and Oxygen Therapy: Patient Spontanous Breathing  Post-op Pain: 3 /10, mild  Post-op Assessment: Post-op Vital signs reviewed, Patient's Cardiovascular Status Stable, Respiratory Function Stable, Patent Airway, No signs of Nausea or vomiting and Pain level controlled  Post-op Vital Signs: Reviewed and stable  Complications: No apparent anesthesia complications

## 2012-12-29 NOTE — Transfer of Care (Signed)
Immediate Anesthesia Transfer of Care Note  Patient: Phillip Daugherty  Procedure(s) Performed: Procedure(s): HERNIA REPAIR UMBILICAL ADULT (N/A) INSERTION OF MESH (N/A)  Patient Location: PACU  Anesthesia Type:General  Level of Consciousness: awake, alert , oriented and patient cooperative  Airway & Oxygen Therapy: Patient Spontanous Breathing and Patient connected to face mask oxygen  Post-op Assessment: Report given to PACU RN, Post -op Vital signs reviewed and stable and Patient moving all extremities  Post vital signs: Reviewed and stable  Complications: No apparent anesthesia complications

## 2012-12-29 NOTE — Interval H&P Note (Signed)
History and Physical Interval Note:  12/29/2012 7:27 AM  Phillip Daugherty  has presented today for surgery, with the diagnosis of umbilical hernia  The various methods of treatment have been discussed with the patient and family. After consideration of risks, benefits and other options for treatment, the patient has consented to  Procedure(s): HERNIA REPAIR UMBILICAL ADULT (N/A) as a surgical intervention .  The patient's history has been reviewed, patient examined, no change in status, stable for surgery.  I have reviewed the patient's chart and labs.  Questions were answered to the patient's satisfaction.     Franky Macho A

## 2012-12-29 NOTE — Anesthesia Preprocedure Evaluation (Signed)
Anesthesia Evaluation  Patient identified by MRN, date of birth, ID band Patient awake    Reviewed: Allergy & Precautions, H&P , NPO status , Patient's Chart, lab work & pertinent test results  Airway Mallampati: II TM Distance: >3 FB Neck ROM: Full    Dental   Pulmonary former smoker,  breath sounds clear to auscultation        Cardiovascular hypertension, Pt. on medications Rhythm:Regular Rate:Normal     Neuro/Psych    GI/Hepatic GERD-  Medicated,  Endo/Other    Renal/GU      Musculoskeletal   Abdominal   Peds  Hematology   Anesthesia Other Findings   Reproductive/Obstetrics                           Anesthesia Physical Anesthesia Plan  ASA: II  Anesthesia Plan: General   Post-op Pain Management:    Induction: Intravenous, Rapid sequence and Cricoid pressure planned  Airway Management Planned: Oral ETT  Additional Equipment:   Intra-op Plan:   Post-operative Plan: Extubation in OR  Informed Consent: I have reviewed the patients History and Physical, chart, labs and discussed the procedure including the risks, benefits and alternatives for the proposed anesthesia with the patient or authorized representative who has indicated his/her understanding and acceptance.     Plan Discussed with:   Anesthesia Plan Comments:         Anesthesia Quick Evaluation

## 2012-12-29 NOTE — Anesthesia Procedure Notes (Signed)
Procedure Name: Intubation Date/Time: 12/29/2012 7:41 AM Performed by: Despina Hidden Pre-anesthesia Checklist: Emergency Drugs available, Patient identified, Suction available and Patient being monitored Patient Re-evaluated:Patient Re-evaluated prior to inductionOxygen Delivery Method: Circle system utilized Preoxygenation: Pre-oxygenation with 100% oxygen Intubation Type: IV induction, Rapid sequence and Cricoid Pressure applied Ventilation: Mask ventilation without difficulty and Oral airway inserted - appropriate to patient size Laryngoscope Size: Mac and 3 Grade View: Grade II Tube type: Oral Tube size: 8.0 mm Number of attempts: 1 Airway Equipment and Method: Stylet Placement Confirmation: ETT inserted through vocal cords under direct vision,  positive ETCO2 and breath sounds checked- equal and bilateral Secured at: 24 cm Tube secured with: Tape Dental Injury: Teeth and Oropharynx as per pre-operative assessment  Difficulty Due To: Difficulty was anticipated

## 2012-12-31 ENCOUNTER — Encounter (HOSPITAL_COMMUNITY): Payer: Self-pay | Admitting: General Surgery

## 2013-01-13 ENCOUNTER — Encounter: Payer: Managed Care, Other (non HMO) | Attending: Physician Assistant | Admitting: *Deleted

## 2013-01-13 ENCOUNTER — Encounter: Payer: Self-pay | Admitting: *Deleted

## 2013-01-13 VITALS — Ht 67.25 in | Wt 240.0 lb

## 2013-01-13 DIAGNOSIS — R7309 Other abnormal glucose: Secondary | ICD-10-CM | POA: Insufficient documentation

## 2013-01-13 DIAGNOSIS — Z713 Dietary counseling and surveillance: Secondary | ICD-10-CM | POA: Insufficient documentation

## 2013-01-13 DIAGNOSIS — R7303 Prediabetes: Secondary | ICD-10-CM

## 2013-01-13 NOTE — Progress Notes (Signed)
  Medical Nutrition Therapy:  Appt start time: 1700 end time:  1800.  Assessment:  Primary concerns today: here for pre-diabetes. He is here with his wife, Phillip Daugherty. They live together, she states she has reactive hypoglycemia. He has recently had his A1c tested with 6.1%. Works as Multimedia programmer and works various shifts. He is on 3rd shift now. He rotates shifts every 2 weeks. He walks a bit at work, not much at home. He is able to walk the dogs occasionally (2 rotweillers and 2 great danes). Has had several back, knee and shoulder surgeries which limit his ability to be active.  Preferred Learning Style:   No preference indicated   Learning Readiness:   Contemplating  MEDICATIONS: see list   DIETARY INTAKE:  24-hr recall:  B ( AM): 2 biscuits with 2 sausage patties (junior size), coffee with international creamer,   Snk ( AM): 2 hot dogs with BBQ sauce, 1 pkg low fat PNB crackers, coffee with creamer  L ( PM): sandwich or hot meal (left overs in individual containers) occasionally chips, diet soda and coffee Snk ( PM): occasionally cookies or donut holes, D ( PM): hot dogs with chips OR sandwich OR hot meal with protein, some beans occasionally, some whole grain pasta or brown rice, but no fruits or vegetables Snk ( PM): ice cream sandwich or 100 calorie cone. Beverages: coffee, diet soda  Usual physical activity: walks at work, and limited in walking dogs at home  Estimated energy needs: 1500 calories 170 g carbohydrates 112 g protein 42 g fat  Intervention:  Nutrition counseling and pre- diabetes education initiated. Discussed basic physiology of diabetes, SMBG and rationale of checking BG at alternate times of day, A1c, Carb Counting and reading food labels, and benefits of increased activity in a manner that is safe for him.  .Plan:  Aim for 3 Carb Choices per meal (45 grams) +/- 1 either way  Aim for 0-2 Carbs per snack if hungry  Consider reading food labels for Total  Carbohydrate and Fat Grams of foods Consider  increasing your activity level by walking daily as tolerated Consider checking with your MD about Rx for checking BG   Teaching Method Utilized: all of the following Visual Auditory Hands on  Handouts given during visit include: Living Well with Diabetes Carb Counting and Food Label handouts Meal Plan Card  Barriers to learning/adherence to lifestyle change: multiple surgeries  Demonstrated degree of understanding via:  Teach Back   Monitoring/Evaluation:  Dietary intake, exercise, reading food labels, and body weight prn.

## 2013-01-13 NOTE — Patient Instructions (Signed)
.  Plan:  Aim for 3 Carb Choices per meal (45 grams) +/- 1 either way  Aim for 0-2 Carbs per snack if hungry  Consider reading food labels for Total Carbohydrate and Fat Grams of foods Consider  increasing your activity level by walking daily as tolerated Consider checking with your MD about Rx for checking BG

## 2014-03-02 ENCOUNTER — Encounter: Payer: Self-pay | Admitting: Cardiology

## 2014-03-02 ENCOUNTER — Encounter: Payer: Self-pay | Admitting: *Deleted

## 2014-03-02 ENCOUNTER — Ambulatory Visit (INDEPENDENT_AMBULATORY_CARE_PROVIDER_SITE_OTHER): Payer: Managed Care, Other (non HMO) | Admitting: Cardiology

## 2014-03-02 VITALS — BP 120/69 | HR 90 | Ht 66.0 in | Wt 221.0 lb

## 2014-03-02 DIAGNOSIS — I10 Essential (primary) hypertension: Secondary | ICD-10-CM

## 2014-03-02 DIAGNOSIS — E785 Hyperlipidemia, unspecified: Secondary | ICD-10-CM

## 2014-03-02 DIAGNOSIS — R079 Chest pain, unspecified: Secondary | ICD-10-CM

## 2014-03-02 DIAGNOSIS — I493 Ventricular premature depolarization: Secondary | ICD-10-CM

## 2014-03-02 DIAGNOSIS — M79606 Pain in leg, unspecified: Secondary | ICD-10-CM

## 2014-03-02 MED ORDER — PRAVASTATIN SODIUM 40 MG PO TABS: 40.0000 mg | ORAL_TABLET | Freq: Every evening | ORAL | Status: DC

## 2014-03-02 NOTE — Patient Instructions (Addendum)
Your physician recommends that you schedule a follow-up appointment TO BE DETERMINED AFTER STRESS TEST  Your physician has recommended you make the following change in your medication:   STOP SIMVASTATIN   START PRAVASTATIN 40 MG DAILY WE HAVE ORDERED 90 DAY SUPPLY  Your physician has requested that you have an exercise tolerance test. For further information please visit [email or web address]. Please also follow instruction sheet, as given.  Your physician has requested that you have an ankle brachial index (ABI). During this test an ultrasound and blood pressure cuff are used to evaluate the arteries that supply the arms and legs with blood. Allow thirty minutes for this exam. There are no restrictions or special instructions.  WE WILL REQUEST LABS FROM YOUR PRIMARY PHYSICIAN   Thank you for choosing [facility]!!

## 2014-03-02 NOTE — Progress Notes (Signed)
Clinical Summary Mr. Phillip Daugherty is a 49 y.o.male seen today as a new patient for the following medical problems.  1. Irregular heart beat - seen previously by Dr Hyacinth Meeker in [place] - notes mention history of PVCs. Workup with echo and exercise cardiolite without evidence of strucutural heart disease - echo 04/2004 LVEF 50-55%, mod LVH, mild MR, mild TR - denies any significant palpitations  2. Chest pain - dull pain mid to left chest, 3-5/10. Typically at rest. Can last up to 3-4 minutes. No other associated symptoms. No positional. Occurs approx once a week. Started approx 6 months. Mild increase fequency, stable severity. - denies any SOB, no DOE  3. HTN - complaint with meds  4. Leg pain - primarily left calf pain, can occur at rest but most commonly with activity.  - former tobacco 30 years  5. Hyperlipidemia - compliant with statin, no recent labs in our system  Past Medical History  Diagnosis Date  . Hypertension   . High cholesterol   . Arthritis      No Known Allergies   Current Outpatient Prescriptions  Medication Sig Dispense Refill  . aspirin EC 81 MG tablet Take 81 mg by mouth daily.    . citalopram (CELEXA) 20 MG tablet Take 20 mg by mouth daily.    Marland Kitchen levocetirizine (XYZAL) 5 MG tablet Take 5 mg by mouth daily.    Marland Kitchen lisinopril-hydrochlorothiazide (PRINZIDE,ZESTORETIC) 20-12.5 MG per tablet Take 1 tablet by mouth daily.    . magnesium oxide (MAG-OX) 400 MG tablet Take 400 mg by mouth daily.    . montelukast (SINGULAIR) 10 MG tablet Take 10 mg by mouth at bedtime.    . Multiple Vitamin (MULTIVITAMIN) tablet Take 1 tablet by mouth daily.    Marland Kitchen OVER THE COUNTER MEDICATION Place 1 spray into the nose daily as needed (for sinuses.).    Marland Kitchen oxyCODONE-acetaminophen (PERCOCET) 7.5-325 MG per tablet Take 1-2 tablets by mouth every 4 (four) hours as needed. 50 tablet 0  . pantoprazole (PROTONIX) 40 MG tablet Take 40 mg by mouth daily.    . simvastatin (ZOCOR) 40 MG  tablet Take 40 mg by mouth every evening.     No current facility-administered medications for this visit.     Past Surgical History  Procedure Laterality Date  . Tonsillectomy    . Shoulder arthroscopy Left   . Knee arthroscopy Left   . Umbilical hernia repair N/A 12/29/2012    Procedure: HERNIA REPAIR UMBILICAL ADULT;  Surgeon: Dalia Heading, MD;  Location: [facility];  Service: General;  Laterality: N/A;  . Insertion of mesh N/A 12/29/2012    Procedure: INSERTION OF MESH;  Surgeon: Dalia Heading, MD;  Location: [facility];  Service: General;  Laterality: N/A;     No Known Allergies    No family history on file.   Social History Mr. Gutridge reports that he quit smoking about 16 months ago. His smoking use included Cigarettes. He has a 30 pack-year smoking history. He does not have any smokeless tobacco history on file. Mr. Beaver reports that he does not drink alcohol.   Review of Systems CONSTITUTIONAL: No weight loss, fever, chills, weakness or fatigue.  HEENT: Eyes: No visual loss, blurred vision, double vision or yellow sclerae.No hearing loss, sneezing, congestion, runny nose or sore throat.  SKIN: No rash or itching.  CARDIOVASCULAR: per HPI RESPIRATORY: No shortness of breath, cough or sputum.  GASTROINTESTINAL: No anorexia, nausea, vomiting or diarrhea. No abdominal  pain or blood.  GENITOURINARY: No burning on urination, no polyuria NEUROLOGICAL: No headache, dizziness, syncope, paralysis, ataxia, numbness or tingling in the extremities. No change in bowel or bladder control.  MUSCULOSKELETAL: diffuse muscle aches, joint pains LYMPHATICS: No enlarged nodes. No history of splenectomy.  PSYCHIATRIC: No history of depression or anxiety.  ENDOCRINOLOGIC: No reports of sweating, cold or heat intolerance. No polyuria or polydipsia.  Marland Kitchen.   Physical Examination p 90 bp 120/69 Wt 221 lbs BMI 36 Gen: resting comfortably, no acute distress HEENT: no scleral icterus, pupils equal  round and reactive, no palptable cervical adenopathy,  CV: RRR, no m/r/g, no JVD, no carotid bruits, 2+ bilateral DP/PT pulses Resp: Clear to auscultation bilaterally GI: abdomen is soft, non-tender, non-distended, normal bowel sounds, no hepatosplenomegaly MSK: extremities are warm, no edema.  Skin: warm, no rash Neuro:  no focal deficits Psych: appropriate affect   Diagnostic Studies echo 04/2004 LVEF 50-55%, mod LVH, mild MR, mild TR     Assessment and Plan  1. Irregular heart beat - hx of PVCs, previous work up without structural heart disease in 2006 - no current palpitations, continue to follow  2. Chest pain - unclear etiology, patient with multiple CAD risk factors including age, tobacco, HTN, HL - will obtain GXT  3. HTN - at goal, continue current meds  4. Hyperlipidemia - non-specific muscle and joint pains, will try changing simva to prava  5. Leg pain - primarily left calf pain, often with exertion. Long history of tobacco use. Will obtain ABIs    F/u pending stress results   Antoine PocheJonathan F. Branch, M.D.

## 2014-03-03 ENCOUNTER — Encounter (INDEPENDENT_AMBULATORY_CARE_PROVIDER_SITE_OTHER): Payer: Managed Care, Other (non HMO)

## 2014-03-03 ENCOUNTER — Telehealth: Payer: Self-pay | Admitting: Cardiology

## 2014-03-03 DIAGNOSIS — M79606 Pain in leg, unspecified: Secondary | ICD-10-CM

## 2014-03-03 NOTE — Telephone Encounter (Signed)
Please call Mrs. Phillip Daugherty in reference to her husband's stress test set for next week. Has questions about taking blood pressure medication. Please call # [phone]

## 2014-03-03 NOTE — Telephone Encounter (Signed)
Pt wife questioning whether to take medications before GXT. Explained that medication affects and what could be taken before stress test. Pt wife verbalized understanding

## 2014-03-05 ENCOUNTER — Telehealth: Payer: Self-pay | Admitting: *Deleted

## 2014-03-05 NOTE — Telephone Encounter (Signed)
-----   Message from Antoine PocheJonathan F Branch, MD sent at 03/05/2014  1:45 PM EST ----- ABIs show normal circulation in the legs  Dominga FerryJ Branch MD

## 2014-03-05 NOTE — Telephone Encounter (Signed)
Pt made aware, forwarded to Dr. Loreta AveMann

## 2014-03-10 ENCOUNTER — Ambulatory Visit (HOSPITAL_COMMUNITY)
Admission: RE | Admit: 2014-03-10 | Discharge: 2014-03-10 | Disposition: A | Discharge status: Home / Self Care | Payer: Managed Care, Other (non HMO) | Source: Ambulatory Visit | Attending: Cardiology | Admitting: Cardiology

## 2014-03-10 DIAGNOSIS — R079 Chest pain, unspecified: Secondary | ICD-10-CM | POA: Insufficient documentation

## 2014-03-10 NOTE — Progress Notes (Addendum)
Stress Lab Nurses Notes - [facility]  Karsten Roodd Zheng 03/10/2014 Reason for doing test: Chest Pain Type of test: Regular GTX Nurse performing test: Parke PoissonPhyllis Billingsly, RN Nuclear Medicine Tech: Not Applicable Echo Tech: Not Applicable MD performing test: S. McDowell/M.Geni BersLenze NP Family MD: B.Mann PA-C Test explained and consent signed: Yes.   IV started: No IV started Symptoms: Discomfort in lower legs & SOB Treatment/Intervention: None Reason test stopped: reached target HR After recovery IV was: NA Patient to return to Nuc. Med at : NA Patient discharged: Home Patient's Condition upon discharge was: stable Comments: During test peak BP 146/80 & HR 157.  Recovery BP 137/80 & HR 95 .  Symptoms resolved in recovery. Erskine SpeedBillingsley, Phyllis T    Patient exercised according to the Bruce protocol for 7 min 5 sec achieving 10.10 METs. Resting HR increased from 83 bp to 166 bpm, representing 97% of THR. BP increased from 129/88 up to 146/80. The test was stopped due to fatigue, the patient did not experience any chest pain. 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 Stress EKG showed no ischemic changes and no arrhythmias. Occasional PVCs at rest resolved with exercise  Findings 1. Negative exercise stress EKG for ischemia 2. Duke treadmill score of 7, consistent with low risk for major cardiac events. 3. Good exercise functional capacity (110% of predicted based on age and gender)   Dominga FerryJ Deloria Brassfield MD

## 2014-03-11 ENCOUNTER — Telehealth: Payer: Self-pay | Admitting: *Deleted

## 2014-03-11 NOTE — Telephone Encounter (Signed)
Stress test looks good, no evidence of any blockages. He should see me back in 3-4 weeks to f/u on his symptoms   Dominga FerryJ Branch MD  Pt made aware, will call back to schedule appt. Pt did not want to make appt at this time

## 2014-06-09 ENCOUNTER — Encounter (INDEPENDENT_AMBULATORY_CARE_PROVIDER_SITE_OTHER): Payer: Self-pay | Admitting: *Deleted

## 2014-06-30 ENCOUNTER — Encounter (INDEPENDENT_AMBULATORY_CARE_PROVIDER_SITE_OTHER): Payer: Self-pay | Admitting: Internal Medicine

## 2014-06-30 ENCOUNTER — Telehealth (INDEPENDENT_AMBULATORY_CARE_PROVIDER_SITE_OTHER): Payer: Self-pay | Admitting: *Deleted

## 2014-06-30 ENCOUNTER — Ambulatory Visit (INDEPENDENT_AMBULATORY_CARE_PROVIDER_SITE_OTHER): Payer: Managed Care, Other (non HMO) | Admitting: Internal Medicine

## 2014-06-30 ENCOUNTER — Other Ambulatory Visit (INDEPENDENT_AMBULATORY_CARE_PROVIDER_SITE_OTHER): Payer: Self-pay | Admitting: *Deleted

## 2014-06-30 VITALS — BP 90/60 | HR 64 | Temp 97.5°F | Ht 67.0 in | Wt 188.8 lb

## 2014-06-30 DIAGNOSIS — K5909 Other constipation: Secondary | ICD-10-CM

## 2014-06-30 DIAGNOSIS — Z800 Family history of malignant neoplasm of digestive organs: Secondary | ICD-10-CM

## 2014-06-30 MED ORDER — PEG 3350-KCL-NA BICARB-NACL 420 G PO SOLR: 4000.0000 mL | Freq: Once | ORAL | Status: DC

## 2014-06-30 NOTE — Addendum Note (Signed)
Addended by: Len BlalockSETZER, TERRI L on: 06/30/2014 04:59 PM   Modules accepted: Orders

## 2014-06-30 NOTE — Progress Notes (Signed)
Subjective:    Patient ID: Phillip Daugherty, male    DOB: Dec 13, 1965, 49 y.o.   MRN: [id number]  HPIReferred to our office by Dwyane Luo PA-C for constipation [facility]). Patient tells me he has been constipated. Stools are hard. He has had constipation for at least 6 months.   He has been trying Gummies which did not help. He also tried Doculax x2 and Miralax daily.  He is having a stool daily or he may skip a day. He says with the Duculax and Miralax his stools are very loose. Without these two medicines his stools are very hard. He has tried Magnesium Citrate which causes diarrhea. He denies any melena or BRRB.  He has been on chronic pain med for years.  He has tried Writer which has not helped. Took Movantac x 2 months.  Appetite is good for the most part.  He has been dieting for weight loss.  He has lost 35 pounds since March 23. No dysphagia.  His last colonoscopy was over 5 yrs ago which he says was normal.  Dr. Fonda Kinder in [place].    Family hx of colon cancer in father in his 62s. Review of Systems Married.    Past Medical History  Diagnosis Date  . Hypertension   . High cholesterol   . Arthritis     Past Surgical History  Procedure Laterality Date  . Tonsillectomy    . Shoulder arthroscopy Left   . Knee arthroscopy Left   . Umbilical hernia repair N/A 12/29/2012    Procedure: HERNIA REPAIR UMBILICAL ADULT;  Surgeon: Dalia Heading, MD;  Location: [facility];  Service: General;  Laterality: N/A;  . Insertion of mesh N/A 12/29/2012    Procedure: INSERTION OF MESH;  Surgeon: Dalia Heading, MD;  Location: [facility];  Service: General;  Laterality: N/A;    Allergies  Allergen Reactions  . Latex     Rash   . Tape     Rash     Current Outpatient Prescriptions on File Prior to Visit  Medication Sig Dispense Refill  . ALPRAZolam (XANAX) 0.5 MG tablet Take by mouth at bedtime.  0  . aspirin EC 81 MG tablet Take 81 mg by mouth daily.    . cyanocobalamin 2000 MCG tablet  Take 2,000 mcg by mouth daily.    . diphenhydrAMINE (BENADRYL) 50 MG capsule Take 50 mg by mouth at bedtime as needed.    . DUEXIS 800-26.6 MG TABS Take 1 tablet by mouth 3 (three) times daily. Takes twice daily    . HYDROcodone-acetaminophen (NORCO) 10-325 MG per tablet Take 1 tablet by mouth every 6 (six) hours as needed. for pain  0  . levocetirizine (XYZAL) 5 MG tablet Take 5 mg by mouth daily.    Marland Kitchen lisinopril-hydrochlorothiazide (PRINZIDE,ZESTORETIC) 20-12.5 MG per tablet Take 1 tablet by mouth daily.    . montelukast (SINGULAIR) 10 MG tablet Take 10 mg by mouth at bedtime.    . Multiple Vitamin (MULTIVITAMIN) tablet Take 1 tablet by mouth daily.    . Probiotic Product (PROBIOTIC DAILY PO) Take 1 capsule by mouth daily.    . Turmeric 500 MG CAPS Take 1 capsule by mouth daily.     No current facility-administered medications on file prior to visit.     Objective:   Physical Exam Blood pressure 90/60, pulse 64, temperature 97.5 F (36.4 C), height  (1.702 m), weight 188 lb 12.8 oz (85.639 kg).  Alert and oriented.  Skin warm and dry. Oral mucosa is moist.   . Sclera anicteric, conjunctivae is pink. Thyroid not enlarged. No cervical lymphadenopathy. Lungs clear. Heart regular rate and rhythm.  Abdomen is soft. Bowel sounds are positive. No hepatomegaly. No abdominal masses felt. No tenderness.  No edema to lower extremities.         Assessment & Plan:  Chronic constipation. ? Opiate induced. Will schedule a colonoscopy.The risks and benefits such as perforation, bleeding, and infection were reviewed with the patient and is agreeable. Will try on Linzess daily. (Samples given x 4)

## 2014-06-30 NOTE — Telephone Encounter (Signed)
Ann, please resend. Would not go thru.

## 2014-06-30 NOTE — Patient Instructions (Signed)
Samples of Linzess. Colonoscopy. The risks and benefits such as perforation, bleeding, and infection were reviewed with the patient and is agreeable. 

## 2014-06-30 NOTE — Telephone Encounter (Signed)
Needs trilyte 

## 2014-06-30 NOTE — Telephone Encounter (Signed)
Patient needs trilyte 

## 2014-06-30 NOTE — Addendum Note (Signed)
Addended by: Luster LandsbergILLEY, Bernece Gall H on: 06/30/2014 12:04 PM   Modules accepted: Orders

## 2014-07-08 ENCOUNTER — Encounter (INDEPENDENT_AMBULATORY_CARE_PROVIDER_SITE_OTHER): Payer: Self-pay

## 2014-07-12 ENCOUNTER — Encounter (INDEPENDENT_AMBULATORY_CARE_PROVIDER_SITE_OTHER): Payer: Self-pay | Admitting: Internal Medicine

## 2014-07-12 ENCOUNTER — Other Ambulatory Visit (INDEPENDENT_AMBULATORY_CARE_PROVIDER_SITE_OTHER): Payer: Self-pay | Admitting: Internal Medicine

## 2014-07-12 DIAGNOSIS — K5909 Other constipation: Secondary | ICD-10-CM

## 2014-07-12 MED ORDER — LINACLOTIDE 290 MCG PO CAPS: 290.0000 ug | ORAL_CAPSULE | Freq: Every day | ORAL | Status: DC

## 2014-07-12 NOTE — Telephone Encounter (Signed)
Rx for Linzess sent to pharmacy

## 2014-08-04 ENCOUNTER — Ambulatory Visit (HOSPITAL_COMMUNITY)
Admission: RE | Admit: 2014-08-04 | Discharge: 2014-08-04 | Disposition: A | Discharge status: Home / Self Care | Payer: Managed Care, Other (non HMO) | Source: Ambulatory Visit | Attending: Internal Medicine | Admitting: Internal Medicine

## 2014-08-04 ENCOUNTER — Encounter (HOSPITAL_COMMUNITY)
Admission: RE | Disposition: A | Discharge status: Home / Self Care | Payer: Self-pay | Source: Ambulatory Visit | Attending: Internal Medicine

## 2014-08-04 ENCOUNTER — Encounter (HOSPITAL_COMMUNITY): Payer: Self-pay | Admitting: *Deleted

## 2014-08-04 DIAGNOSIS — M1990 Unspecified osteoarthritis, unspecified site: Secondary | ICD-10-CM | POA: Diagnosis not present

## 2014-08-04 DIAGNOSIS — Z79891 Long term (current) use of opiate analgesic: Secondary | ICD-10-CM | POA: Insufficient documentation

## 2014-08-04 DIAGNOSIS — K5909 Other constipation: Secondary | ICD-10-CM

## 2014-08-04 DIAGNOSIS — K644 Residual hemorrhoidal skin tags: Secondary | ICD-10-CM | POA: Diagnosis not present

## 2014-08-04 DIAGNOSIS — Z87891 Personal history of nicotine dependence: Secondary | ICD-10-CM | POA: Diagnosis not present

## 2014-08-04 DIAGNOSIS — Z1211 Encounter for screening for malignant neoplasm of colon: Secondary | ICD-10-CM | POA: Diagnosis not present

## 2014-08-04 DIAGNOSIS — K5730 Diverticulosis of large intestine without perforation or abscess without bleeding: Secondary | ICD-10-CM | POA: Diagnosis not present

## 2014-08-04 DIAGNOSIS — Z800 Family history of malignant neoplasm of digestive organs: Secondary | ICD-10-CM | POA: Insufficient documentation

## 2014-08-04 DIAGNOSIS — I10 Essential (primary) hypertension: Secondary | ICD-10-CM | POA: Insufficient documentation

## 2014-08-04 DIAGNOSIS — K5900 Constipation, unspecified: Secondary | ICD-10-CM | POA: Diagnosis present

## 2014-08-04 DIAGNOSIS — K648 Other hemorrhoids: Secondary | ICD-10-CM | POA: Diagnosis not present

## 2014-08-04 DIAGNOSIS — Z79899 Other long term (current) drug therapy: Secondary | ICD-10-CM | POA: Diagnosis not present

## 2014-08-04 HISTORY — PX: COLONOSCOPY: SHX5424

## 2014-08-04 SURGERY — COLONOSCOPY
Anesthesia: Moderate Sedation

## 2014-08-04 MED ORDER — MIDAZOLAM HCL 5 MG/5ML IJ SOLN
INTRAMUSCULAR | Status: AC
  Filled 2014-08-04: qty 10

## 2014-08-04 MED ORDER — MEPERIDINE HCL 50 MG/ML IJ SOLN
INTRAMUSCULAR | Status: AC
  Filled 2014-08-04: qty 1

## 2014-08-04 MED ORDER — MIDAZOLAM HCL 5 MG/5ML IJ SOLN
INTRAMUSCULAR | Status: AC
  Filled 2014-08-04: qty 5

## 2014-08-04 MED ORDER — MEPERIDINE HCL 50 MG/ML IJ SOLN
INTRAMUSCULAR | Status: DC | PRN
  Administered 2014-08-04 (×2): 25 mg via INTRAVENOUS

## 2014-08-04 MED ORDER — MIDAZOLAM HCL 5 MG/5ML IJ SOLN
INTRAMUSCULAR | Status: DC | PRN
Start: 2014-08-04 — End: 2014-08-04
  Administered 2014-08-04: 3 mg via INTRAVENOUS
  Administered 2014-08-04 (×2): 2 mg via INTRAVENOUS
  Administered 2014-08-04: 3 mg via INTRAVENOUS
  Administered 2014-08-04 (×2): 2 mg via INTRAVENOUS

## 2014-08-04 MED ORDER — SODIUM CHLORIDE 0.9 % IV SOLN
INTRAVENOUS | Status: DC
  Administered 2014-08-04: 1000 mL via INTRAVENOUS

## 2014-08-04 MED ORDER — STERILE WATER FOR IRRIGATION IR SOLN
Status: DC | PRN
  Administered 2014-08-04: 13:00:00

## 2014-08-04 NOTE — Discharge Instructions (Signed)
Resume usual medications and high fiber diet. °No driving for 24 hours. °Next colonoscopy in 5 years. ° ° ° ° ° ° °Colonoscopy, Care After °These instructions give you information on caring for yourself after your procedure. Your doctor may also give you more specific instructions. Call your doctor if you have any problems or questions after your procedure. °HOME CARE °· Do not drive for 24 hours. °· Do not sign important papers or use machinery for 24 hours. °· You may shower. °· You may go back to your usual activities, but go slower for the first 24 hours. °· Take rest breaks often during the first 24 hours. °· Walk around or use warm packs on your belly (abdomen) if you have belly cramping or gas. °· Drink enough fluids to keep your pee (urine) clear or pale yellow. °· Resume your normal diet. Avoid heavy or fried foods. °· Avoid drinking alcohol for 24 hours or as told by your doctor. °· Only take medicines as told by your doctor. °If a tissue sample (biopsy) was taken during the procedure:  °· Do not take aspirin or blood thinners for 7 days, or as told by your doctor. °· Do not drink alcohol for 7 days, or as told by your doctor. °· Eat soft foods for the first 24 hours. °GET HELP IF: °You still have a small amount of blood in your poop (stool) 2-3 days after the procedure. °GET HELP RIGHT AWAY IF: °· You have more than a small amount of blood in your poop. °· You see clumps of tissue (blood clots) in your poop. °· Your belly is puffy (swollen). °· You feel sick to your stomach (nauseous) or throw up (vomit). °· You have a fever. °· You have belly pain that gets worse and medicine does not help. °MAKE SURE YOU: °· Understand these instructions. °· Will watch your condition. °· Will get help right away if you are not doing well or get worse. °Document Released: 02/17/2010 Document Revised: 01/20/2013 Document Reviewed: 09/22/2012 °ExitCare® Patient Information ©2015 [employer]. This information is not  intended to replace advice given to you by your health care provider. Make sure you discuss any questions you have with your health care provider. ° °Diverticulosis °Diverticulosis is the condition that develops when small pouches (diverticula) form in the wall of your colon. Your colon, or large intestine, is where water is absorbed and stool is formed. The pouches form when the inside layer of your colon pushes through weak spots in the outer layers of your colon. °CAUSES  °No one knows exactly what causes diverticulosis. °RISK FACTORS °· Being older than 50. Your risk for this condition increases with age. Diverticulosis is rare in people younger than 40 years. By age 80, almost everyone has it. °· Eating a low-fiber diet. °· Being frequently constipated. °· Being overweight. °· Not getting enough exercise. °· Smoking. °· Taking over-the-counter pain medicines, like aspirin and ibuprofen. °SYMPTOMS  °Most people with diverticulosis do not have symptoms. °DIAGNOSIS  °Because diverticulosis often has no symptoms, health care providers often discover the condition during an exam for other colon problems. In many cases, a health care provider will diagnose diverticulosis while using a flexible scope to examine the colon (colonoscopy). °TREATMENT  °If you have never developed an infection related to diverticulosis, you may not need treatment. If you have had an infection before, treatment may include: °· Eating more fruits, vegetables, and grains. °· Taking a fiber supplement. °· Taking a live   bacteria supplement (probiotic). °· Taking medicine to relax your colon. °HOME CARE INSTRUCTIONS  °· Drink at least 6-8 glasses of water each day to prevent constipation. °· Try not to strain when you have a bowel movement. °· Keep all follow-up appointments. °If you have had an infection before:  °· Increase the fiber in your diet as directed by your health care provider or dietitian. °· Take a dietary fiber supplement if your  health care provider approves. °· Only take medicines as directed by your health care provider. °SEEK MEDICAL CARE IF:  °· You have abdominal pain. °· You have bloating. °· You have cramps. °· You have not gone to the bathroom in 3 days. °SEEK IMMEDIATE MEDICAL CARE IF:  °· Your pain gets worse. °· Your bloating becomes very bad. °· You have a fever or chills, and your symptoms suddenly get worse. °· You begin vomiting. °· You have bowel movements that are bloody or black. °MAKE SURE YOU: °· Understand these instructions. °· Will watch your condition. °· Will get help right away if you are not doing well or get worse. °Document Released: 10/13/2003 Document Revised: 01/20/2013 Document Reviewed: 12/10/2012 °ExitCare® Patient Information ©2015 [employer]. This information is not intended to replace advice given to you by your health care provider. Make sure you discuss any questions you have with your health care provider. ° ° °High-Fiber Diet °Fiber is found in fruits, vegetables, and grains. A high-fiber diet encourages the addition of more whole grains, legumes, fruits, and vegetables in your diet. The recommended amount of fiber for adult males is 38 g per day. For adult females, it is 25 g per day. Pregnant and lactating women should get 28 g of fiber per day. If you have a digestive or bowel problem, ask your caregiver for advice before adding high-fiber foods to your diet. Eat a variety of high-fiber foods instead of only a select few type of foods.  °PURPOSE °· To increase stool bulk. °· To make bowel movements more regular to prevent constipation. °· To lower cholesterol. °· To prevent overeating. °WHEN IS THIS DIET USED? °· It may be used if you have constipation and hemorrhoids. °· It may be used if you have uncomplicated diverticulosis (intestine condition) and irritable bowel syndrome. °· It may be used if you need help with weight management. °· It may be used if you want to add it to your diet as a  protective measure against atherosclerosis, diabetes, and cancer. °SOURCES OF FIBER °· Whole-grain breads and cereals. °· Fruits, such as apples, oranges, bananas, berries, prunes, and pears. °· Vegetables, such as green peas, carrots, sweet potatoes, beets, broccoli, cabbage, spinach, and artichokes. °· Legumes, such split peas, soy, lentils. °· Almonds. °FIBER CONTENT IN FOODS °Starches and Grains / Dietary Fiber (g) °· Cheerios, 1 cup / 3 g °· Corn Flakes cereal, 1 cup / 0.7 g °· Rice crispy treat cereal, 1¼ cup / 0.3 g °· Instant oatmeal (cooked), ½ cup / 2 g °· Frosted wheat cereal, 1 cup / 5.1 g °· Brown, long-grain rice (cooked), 1 cup / 3.5 g °· White, long-grain rice (cooked), 1 cup / 0.6 g °· Enriched macaroni (cooked), 1 cup / 2.5 g °Legumes / Dietary Fiber (g) °· Baked beans (canned, plain, or vegetarian), ½ cup / 5.2 g °· Kidney beans (canned), ½ cup / 6.8 g °· Pinto beans (cooked), ½ cup / 5.5 g °Breads and Crackers / Dietary Fiber (g) °· Plain or honey graham crackers, 2   squares / 0.7 g °· Saltine crackers, 3 squares / 0.3 g °· Plain, salted pretzels, 10 pieces / 1.8 g °· Whole-wheat bread, 1 slice / 1.9 g °· White bread, 1 slice / 0.7 g °· Raisin bread, 1 slice / 1.2 g °· Plain bagel, 3 oz / 2 g °· Flour tortilla, 1 oz / 0.9 g °· Corn tortilla, 1 small / 1.5 g °· Hamburger or hotdog bun, 1 small / 0.9 g °Fruits / Dietary Fiber (g) °· Apple with skin, 1 medium / 4.4 g °· Sweetened applesauce, ½ cup / 1.5 g °· Banana, ½ medium / 1.5 g °· Grapes, 10 grapes / 0.4 g °· Orange, 1 small / 2.3 g °· Raisin, 1.5 oz / 1.6 g °· Melon, 1 cup / 1.4 g °Vegetables / Dietary Fiber (g) °· Green beans (canned), ½ cup / 1.3 g °· Carrots (cooked), ½ cup / 2.3 g °· Broccoli (cooked), ½ cup / 2.8 g °· Peas (cooked), ½ cup / 4.4 g °· Mashed potatoes, ½ cup / 1.6 g °· Lettuce, 1 cup / 0.5 g °· Corn (canned), ½ cup / 1.6 g °· Tomato, ½ cup / 1.1 g °Document Released: 01/15/2005 Document Revised: 07/17/2011 Document Reviewed:  04/19/2011 °ExitCare® Patient Information ©2015 [employer]. This information is not intended to replace advice given to you by your health care provider. Make sure you discuss any questions you have with your health care provider. ° °

## 2014-08-04 NOTE — H&P (Signed)
Phillip Daugherty is an 49 y.o. male.   Chief Complaint: Patient is here for colonoscopy. HPI: Patient is 49 year old Caucasian male who is undergoing colonoscopy primarily for screening purposes. Father was diagnosed with colon carcinoma in his 56s and he is now 49 years old. Patient's last colonoscopy was 5 years ago. He has chronic constipation. Constipation has gotten worse with pain medication. Movantik did not help but he is doing better with Linzess. He is not having bowel movement every other day. He is eating a lot of fruits and vegetables every day.  Past Medical History  Diagnosis Date  . Hypertension   . High cholesterol   . Arthritis     Past Surgical History  Procedure Laterality Date  . Tonsillectomy    . Shoulder arthroscopy Left   . Knee arthroscopy Left   . Umbilical hernia repair N/A 12/29/2012    Procedure: HERNIA REPAIR UMBILICAL ADULT;  Surgeon: Dalia Heading, MD;  Location: [facility];  Service: General;  Laterality: N/A;  . Insertion of mesh N/A 12/29/2012    Procedure: INSERTION OF MESH;  Surgeon: Dalia Heading, MD;  Location: [facility];  Service: General;  Laterality: N/A;    History reviewed. No pertinent family history. Social History:  reports that he quit smoking about 21 months ago. His smoking use included Cigarettes. He started smoking about 33 years ago. He has a 30 pack-year smoking history. He has never used smokeless tobacco. He reports that he does not drink alcohol or use illicit drugs.  Allergies:  Allergies  Allergen Reactions  . Latex     Rash   . Tape     Rash     Medications Prior to Admission  Medication Sig Dispense Refill  . ALPRAZolam (XANAX) 0.5 MG tablet Take by mouth at bedtime.  0  . cholecalciferol (VITAMIN D) 400 UNITS TABS tablet Take 400 Units by mouth.    . cyanocobalamin 2000 MCG tablet Take 2,000 mcg by mouth daily.    . Eszopiclone (ESZOPICLONE) 3 MG TABS Take 3 mg by mouth at bedtime as needed (sleep). Take immediately before  bedtime    . HYDROcodone-acetaminophen (NORCO) 10-325 MG per tablet Take 1 tablet by mouth every 6 (six) hours as needed. for pain  0  . levocetirizine (XYZAL) 5 MG tablet Take 5 mg by mouth daily.    . Linaclotide (LINZESS) 290 MCG CAPS capsule Take 1 capsule (290 mcg total) by mouth daily. 80 capsule 3  . lisinopril-hydrochlorothiazide (PRINZIDE,ZESTORETIC) 20-12.5 MG per tablet Take 1 tablet by mouth daily.    . montelukast (SINGULAIR) 10 MG tablet Take 10 mg by mouth at bedtime.    . Probiotic Product (PROBIOTIC DAILY PO) Take 1 capsule by mouth daily.    Marland Kitchen pyridOXINE (VITAMIN B-6) 100 MG tablet Take 100 mg by mouth daily.    . Turmeric 500 MG CAPS Take 1 capsule by mouth daily.    . diphenhydrAMINE (BENADRYL) 50 MG capsule Take 50 mg by mouth at bedtime as needed.      No results found for this or any previous visit (from the past 48 hour(s)). No results found.  ROS  Blood pressure 118/71, pulse 73, temperature 98.8 F (37.1 C), temperature source Oral, resp. rate 16, height  (1.702 m), weight 185 lb (83.915 kg), SpO2 99 %. Physical Exam  Constitutional: He appears well-developed and well-nourished.  HENT:  Mouth/Throat: Oropharynx is clear and moist.  Eyes: Conjunctivae are normal.  Neck: No thyromegaly present.  Cardiovascular: Normal rate, regular rhythm and normal heart sounds.   No murmur heard. Respiratory: Effort normal and breath sounds normal.  GI: Soft. He exhibits no distension and no mass. There is no tenderness.  Musculoskeletal: He exhibits no edema.  Lymphadenopathy:    He has no cervical adenopathy.  Neurological: He is alert.  Skin: Skin is warm and dry.     Assessment/Plan High risk screening colonoscopy. Constipation. Family history of CRC in father.  [name] U 08/04/2014, 1:20 PM

## 2014-08-04 NOTE — Op Note (Signed)
COLONOSCOPY PROCEDURE REPORT  PATIENT:  Phillip Daugherty  MR#:  [id number] Birthdate:  10/01/1965, 49 y.o., male Endoscopist:  Dr. Malissa HippoNajeeb U. Robynn Marcel, MD Referred By:  Lenise HeraldBenjamin Mann, PA-C  Procedure Date: 08/04/2014  Procedure:   Colonoscopy  Indications:  Patient is 49 year old Caucasian male who is here for screening colonoscopy. He has chronic constipation made worse with narcotics but he is doing better with Linzess. Family history significant for CRC and father who was [id number] at the time of diagnosis and died within year of metastatic disease.  Informed Consent:  The procedure and risks were reviewed with the patient and informed consent was obtained.  Medications:  Demerol 50 mg IV Versed 14 mg IV  Description of procedure:  After a digital rectal exam was performed, that colonoscope was advanced from the anus through the rectum and colon to the area of the cecum, ileocecal valve and appendiceal orifice. The cecum was deeply intubated. These structures were well-seen and photographed for the record. From the level of the cecum and ileocecal valve, the scope was slowly and cautiously withdrawn. The mucosal surfaces were carefully surveyed utilizing scope tip to flexion to facilitate fold flattening as needed. The scope was pulled down into the rectum where a thorough exam including retroflexion was performed.  Findings:   Prep satisfactory. Normal mucosa of cecum, ascending colon, hepatic flexure, transverse colon, splenic flexure and descending colon. Few scattered diverticula at sigmoid colon. Normal rectal mucosa. Small hemorrhoids below the dentate line.   Therapeutic/Diagnostic Maneuvers Performed:   None  Complications:  None  Cecal Withdrawal Time:  7  minutes  Impression:  Examination performed to cecum. No evidence of colonic polyps. Mild sigmoid colon diverticulosis. External hemorrhoids.  Recommendations:  Standard instructions given. Next screening exam in 5  years.  Kalev Temme U  08/04/2014 1:59 PM  CC: Dr. Loreta AveMANN, Sharlet SalinaBENJAMIN, PA-C & Dr. Bonnetta BarryNo ref. provider found

## 2014-08-06 ENCOUNTER — Encounter (HOSPITAL_COMMUNITY): Payer: Self-pay | Admitting: Internal Medicine

## 2015-06-17 ENCOUNTER — Other Ambulatory Visit (INDEPENDENT_AMBULATORY_CARE_PROVIDER_SITE_OTHER): Payer: Self-pay | Admitting: Internal Medicine

## 2016-05-29 HISTORY — PX: THUMB FUSION: SUR636

## 2016-09-09 ENCOUNTER — Other Ambulatory Visit (INDEPENDENT_AMBULATORY_CARE_PROVIDER_SITE_OTHER): Payer: Self-pay | Admitting: Internal Medicine

## 2017-01-17 NOTE — Progress Notes (Addendum)
ZOX:WRUEPCP:Mann, Yetta GlassmanBenjamin L, PA-C  Cardiologist: Dina RichJonathan Branch, MD  EKG: denies past year  Stress test: 03/10/14 in EPIC  ECHO: 05/07/2004 in EPIC  Cardiac Cath: pt denies ever  Chest x-ray: pt denies past year

## 2017-01-17 NOTE — Pre-Procedure Instructions (Signed)
Phillip Daugherty  01/17/2017      [id number] - [place],  - [address] [facility] [address]  [place] [id number] Phone: [phone] Fax: [phone]    Your procedure is scheduled on January 23, 2017.  [facility] at 1100 AM.  Call this number if you have problems the morning of surgery:  [phone]   Remember:  Do not eat food or drink liquids after midnight.  Take these medicines the morning of surgery with A SIP OF WATER fluoxetine (prozac), hydrocodone-acetaminophen (norco), linzess, methocarbamol (robaxin).  7 days prior to surgery STOP taking any Aspirin (unless otherwise instructed by your surgeon), Aleve, Naproxen, Ibuprofen, Motrin, Advil, Goody's, BC's, all herbal medications, fish oil, and all vitamins  Continue all other medications as instructed by your physician except follow the above medication instructions before surgery   Do not wear jewelry, make-up or nail polish.  Do not wear lotions, powders, or perfumes, or deodorant.  Men may shave face and neck.  Do not bring valuables to the hospital.  [facility] is not responsible for any belongings or valuables.  Contacts, dentures or bridgework may not be worn into surgery.  Leave your suitcase in the car.  After surgery it may be brought to your room.  For patients admitted to the hospital, discharge time will be determined by your treatment team.  Patients discharged the day of surgery will not be allowed to drive home.   Special instructions:   [place]- Preparing For Surgery  Before surgery, you can play an important role. Because skin is not sterile, your skin needs to be as free of germs as possible. You can reduce the number of germs on your skin by washing with CHG (chlorahexidine gluconate) Soap before surgery.  CHG is an antiseptic cleaner which kills germs and bonds with the skin to continue killing germs even after washing.  Please  do not use if you have an allergy to CHG or antibacterial soaps. If your skin becomes reddened/irritated stop using the CHG.  Do not shave (including legs and underarms) for at least 48 hours prior to first CHG shower. It is OK to shave your face.  Please follow these instructions carefully.   1. Shower the NIGHT BEFORE SURGERY and the MORNING OF SURGERY with CHG.   2. If you chose to wash your hair, wash your hair first as usual with your normal shampoo.  3. After you shampoo, rinse your hair and body thoroughly to remove the shampoo.  4. Use CHG as you would any other liquid soap. You can apply CHG directly to the skin and wash gently with a scrungie or a clean washcloth.   5. Apply the CHG Soap to your body ONLY FROM THE NECK DOWN.  Do not use on open wounds or open sores. Avoid contact with your eyes, ears, mouth and genitals (private parts). Wash Face and genitals (private parts)  with your normal soap.  6. Wash thoroughly, paying special attention to the area where your surgery will be performed.  7. Thoroughly rinse your body with warm water from the neck down.  8. DO NOT shower/wash with your normal soap after using and rinsing off the CHG Soap.  9. Pat yourself dry with a CLEAN TOWEL.  10. Wear CLEAN PAJAMAS to bed the night before surgery, wear comfortable clothes the morning of surgery  11. Place CLEAN SHEETS on your bed the night of your first  shower and DO NOT SLEEP WITH PETS.    Day of Surgery: Do not apply any deodorants/lotions. Please wear clean clothes to the hospital/surgery center.     Please read over the following fact sheets that you were given. Pain Booklet, Coughing and Deep Breathing and Surgical Site Infection Prevention

## 2017-01-18 ENCOUNTER — Other Ambulatory Visit: Payer: Self-pay

## 2017-01-18 ENCOUNTER — Encounter (HOSPITAL_COMMUNITY): Payer: Self-pay

## 2017-01-18 ENCOUNTER — Encounter (HOSPITAL_COMMUNITY)
Admission: RE | Admit: 2017-01-18 | Discharge: 2017-01-18 | Disposition: A | Discharge status: Home / Self Care | Payer: BLUE CROSS/BLUE SHIELD | Source: Ambulatory Visit | Attending: Orthopedic Surgery | Admitting: Orthopedic Surgery

## 2017-01-18 DIAGNOSIS — M47892 Other spondylosis, cervical region: Secondary | ICD-10-CM | POA: Diagnosis not present

## 2017-01-18 DIAGNOSIS — Z7982 Long term (current) use of aspirin: Secondary | ICD-10-CM | POA: Diagnosis not present

## 2017-01-18 DIAGNOSIS — I10 Essential (primary) hypertension: Secondary | ICD-10-CM | POA: Diagnosis not present

## 2017-01-18 DIAGNOSIS — Z01812 Encounter for preprocedural laboratory examination: Secondary | ICD-10-CM | POA: Insufficient documentation

## 2017-01-18 DIAGNOSIS — E785 Hyperlipidemia, unspecified: Secondary | ICD-10-CM | POA: Diagnosis not present

## 2017-01-18 DIAGNOSIS — M542 Cervicalgia: Secondary | ICD-10-CM | POA: Insufficient documentation

## 2017-01-18 DIAGNOSIS — Z87891 Personal history of nicotine dependence: Secondary | ICD-10-CM | POA: Diagnosis not present

## 2017-01-18 HISTORY — DX: Cardiac arrhythmia, unspecified: I49.9

## 2017-01-18 LAB — CBC
HCT: 37.3 % — ABNORMAL LOW (ref 39.0–52.0)
Hemoglobin: 12.4 g/dL — ABNORMAL LOW (ref 13.0–17.0)
MCH: 30.8 pg (ref 26.0–34.0)
MCHC: 33.2 g/dL (ref 30.0–36.0)
MCV: 92.6 fL (ref 78.0–100.0)
PLATELETS: 346 10*3/uL (ref 150–400)
RBC: 4.03 MIL/uL — ABNORMAL LOW (ref 4.22–5.81)
RDW: 13.7 % (ref 11.5–15.5)
WBC: 7.5 10*3/uL (ref 4.0–10.5)

## 2017-01-18 LAB — BASIC METABOLIC PANEL
Anion gap: 8 (ref 5–15)
BUN: 19 mg/dL (ref 6–20)
CALCIUM: 9.7 mg/dL (ref 8.9–10.3)
CO2: 26 mmol/L (ref 22–32)
Chloride: 103 mmol/L (ref 101–111)
Creatinine, Ser: 0.85 mg/dL (ref 0.61–1.24)
Glucose, Bld: 107 mg/dL — ABNORMAL HIGH (ref 65–99)
Potassium: 4.3 mmol/L (ref 3.5–5.1)
SODIUM: 137 mmol/L (ref 135–145)

## 2017-01-18 LAB — TYPE AND SCREEN
ABO/RH(D): O POS
Antibody Screen: NEGATIVE

## 2017-01-18 LAB — ABO/RH: ABO/RH(D): O POS

## 2017-01-18 LAB — SURGICAL PCR SCREEN
MRSA, PCR: NEGATIVE
Staphylococcus aureus: NEGATIVE

## 2017-01-21 NOTE — Progress Notes (Signed)
Anesthesia Chart Review:  Pt is a 51 year old male scheduled for C5-7 ACDF on 01/23/2017 with Venita Lickahari Brooks, MD  - PCP is Lenise HeraldBenjamin Mann, PA - Saw cardiologist Dina RichJonathan Branch, MD in 2016 for PVCs, chest pain. Stress test ordered, see normal results below. Pt does not routinely see cardiology.   PMH includes:  HTN, hyperlipidemia, hx PVCs. Former smoker. BMI 25. S/p umbilical hernia repair 12/29/12.   Medications include: ASA 81mg , lisinopril-hctz  BP 119/68   Pulse 82   Temp 37.1 C (Oral)   Resp 20   Ht 5\' 7"  (1.702 m)   Wt 157 lb 7 oz (71.4 kg)   SpO2 99%   BMI 24.66 kg/m   Preoperative labs reviewed.    EKG 01/18/17: NSR  Nuclear stress test 03/10/14 (see progress note by Dina RichJonathan Branch. MD in Epic):  1. Negative exercise stress EKG for ischemia 2. Duke treadmill score of 7, consistent with low risk for major cardiac events. 3. Good exercise functional capacity (110% of predicted based on age and gender)  If no changes, I anticipate pt can proceed with surgery as scheduled.   Rica Mastngela Janeisha Ryle, FNP-BC [facility]/Anesthesiology Phone: [phone] 01/21/2017 9:40 AM

## 2017-01-23 ENCOUNTER — Ambulatory Visit (HOSPITAL_COMMUNITY): Payer: BLUE CROSS/BLUE SHIELD | Admitting: Emergency Medicine

## 2017-01-23 ENCOUNTER — Ambulatory Visit (HOSPITAL_COMMUNITY): Payer: BLUE CROSS/BLUE SHIELD

## 2017-01-23 ENCOUNTER — Encounter (HOSPITAL_COMMUNITY)
Admission: RE | Disposition: A | Discharge status: Home / Self Care | Payer: Self-pay | Source: Ambulatory Visit | Attending: Orthopedic Surgery

## 2017-01-23 ENCOUNTER — Encounter (HOSPITAL_COMMUNITY): Payer: Self-pay | Admitting: Anesthesiology

## 2017-01-23 ENCOUNTER — Observation Stay (HOSPITAL_COMMUNITY)
Admission: RE | Admit: 2017-01-23 | Discharge: 2017-01-24 | Disposition: A | Discharge status: Home / Self Care | Payer: BLUE CROSS/BLUE SHIELD | Source: Ambulatory Visit | Attending: Orthopedic Surgery | Admitting: Orthopedic Surgery

## 2017-01-23 ENCOUNTER — Ambulatory Visit (HOSPITAL_COMMUNITY): Payer: BLUE CROSS/BLUE SHIELD | Admitting: Anesthesiology

## 2017-01-23 DIAGNOSIS — Z91040 Latex allergy status: Secondary | ICD-10-CM | POA: Diagnosis not present

## 2017-01-23 DIAGNOSIS — M1990 Unspecified osteoarthritis, unspecified site: Secondary | ICD-10-CM | POA: Diagnosis not present

## 2017-01-23 DIAGNOSIS — M47816 Spondylosis without myelopathy or radiculopathy, lumbar region: Secondary | ICD-10-CM | POA: Insufficient documentation

## 2017-01-23 DIAGNOSIS — M1811 Unilateral primary osteoarthritis of first carpometacarpal joint, right hand: Secondary | ICD-10-CM | POA: Diagnosis not present

## 2017-01-23 DIAGNOSIS — Z981 Arthrodesis status: Secondary | ICD-10-CM

## 2017-01-23 DIAGNOSIS — I493 Ventricular premature depolarization: Secondary | ICD-10-CM | POA: Diagnosis not present

## 2017-01-23 DIAGNOSIS — Z7982 Long term (current) use of aspirin: Secondary | ICD-10-CM | POA: Diagnosis not present

## 2017-01-23 DIAGNOSIS — M4722 Other spondylosis with radiculopathy, cervical region: Secondary | ICD-10-CM | POA: Diagnosis present

## 2017-01-23 DIAGNOSIS — Z79899 Other long term (current) drug therapy: Secondary | ICD-10-CM | POA: Diagnosis not present

## 2017-01-23 DIAGNOSIS — Z888 Allergy status to other drugs, medicaments and biological substances status: Secondary | ICD-10-CM | POA: Diagnosis not present

## 2017-01-23 DIAGNOSIS — E7800 Pure hypercholesterolemia, unspecified: Secondary | ICD-10-CM | POA: Insufficient documentation

## 2017-01-23 DIAGNOSIS — Z87891 Personal history of nicotine dependence: Secondary | ICD-10-CM | POA: Diagnosis not present

## 2017-01-23 DIAGNOSIS — M50122 Cervical disc disorder at C5-C6 level with radiculopathy: Principal | ICD-10-CM | POA: Insufficient documentation

## 2017-01-23 DIAGNOSIS — I10 Essential (primary) hypertension: Secondary | ICD-10-CM | POA: Diagnosis not present

## 2017-01-23 DIAGNOSIS — Z419 Encounter for procedure for purposes other than remedying health state, unspecified: Secondary | ICD-10-CM

## 2017-01-23 HISTORY — PX: ANTERIOR CERVICAL DECOMP/DISCECTOMY FUSION: SHX1161

## 2017-01-23 SURGERY — ANTERIOR CERVICAL DECOMPRESSION/DISCECTOMY FUSION 2 LEVELS
Anesthesia: General | Site: Spine Cervical

## 2017-01-23 MED ORDER — LACTATED RINGERS IV SOLN
INTRAVENOUS | Status: DC | PRN
  Administered 2017-01-23: 16:00:00 via INTRAVENOUS

## 2017-01-23 MED ORDER — LIDOCAINE 2% (20 MG/ML) 5 ML SYRINGE
INTRAMUSCULAR | Status: DC | PRN
  Administered 2017-01-23: 100 mg via INTRAVENOUS

## 2017-01-23 MED ORDER — HEMOSTATIC AGENTS (NO CHARGE) OPTIME
TOPICAL | Status: DC | PRN
  Administered 2017-01-23: 1 via TOPICAL

## 2017-01-23 MED ORDER — MENTHOL 3 MG MT LOZG: 1.0000 | LOZENGE | OROMUCOSAL | Status: DC | PRN

## 2017-01-23 MED ORDER — ACETAMINOPHEN 10 MG/ML IV SOLN
INTRAVENOUS | Status: AC
  Filled 2017-01-23: qty 100

## 2017-01-23 MED ORDER — OXYCODONE-ACETAMINOPHEN 10-325 MG PO TABS: 1.0000 | ORAL_TABLET | ORAL | 0 refills | Status: AC | PRN

## 2017-01-23 MED ORDER — OXYCODONE HCL 5 MG PO TABS
ORAL_TABLET | ORAL | Status: AC
  Administered 2017-01-23: 21:00:00
  Filled 2017-01-23: qty 1

## 2017-01-23 MED ORDER — FENTANYL CITRATE (PF) 250 MCG/5ML IJ SOLN
INTRAMUSCULAR | Status: AC
  Filled 2017-01-23: qty 5

## 2017-01-23 MED ORDER — ACETAMINOPHEN 325 MG PO TABS: 650.0000 mg | ORAL_TABLET | ORAL | Status: DC | PRN

## 2017-01-23 MED ORDER — ONDANSETRON HCL 4 MG PO TABS: 4.0000 mg | ORAL_TABLET | Freq: Four times a day (QID) | ORAL | Status: DC | PRN

## 2017-01-23 MED ORDER — ACETAMINOPHEN 650 MG RE SUPP: 650.0000 mg | RECTAL | Status: DC | PRN

## 2017-01-23 MED ORDER — MIDAZOLAM HCL 2 MG/2ML IJ SOLN
INTRAMUSCULAR | Status: AC
  Filled 2017-01-23: qty 2

## 2017-01-23 MED ORDER — BUPIVACAINE-EPINEPHRINE 0.25% -1:200000 IJ SOLN
INTRAMUSCULAR | Status: DC | PRN
  Administered 2017-01-23: 8 mL

## 2017-01-23 MED ORDER — PROPOFOL 10 MG/ML IV BOLUS
INTRAVENOUS | Status: AC
  Filled 2017-01-23: qty 40

## 2017-01-23 MED ORDER — MORPHINE SULFATE (PF) 4 MG/ML IV SOLN: 2.0000 mg | INTRAVENOUS | Status: DC | PRN

## 2017-01-23 MED ORDER — LACTATED RINGERS IV SOLN: INTRAVENOUS | Status: DC

## 2017-01-23 MED ORDER — MIDAZOLAM HCL 2 MG/2ML IJ SOLN
INTRAMUSCULAR | Status: DC | PRN
  Administered 2017-01-23: 2 mg via INTRAVENOUS

## 2017-01-23 MED ORDER — PHENOL 1.4 % MT LIQD: 1.0000 | OROMUCOSAL | Status: DC | PRN

## 2017-01-23 MED ORDER — OXYCODONE HCL 5 MG PO TABS
10.0000 mg | ORAL_TABLET | ORAL | Status: DC | PRN
  Administered 2017-01-23 – 2017-01-24 (×2): 10 mg via ORAL
  Filled 2017-01-23 (×2): qty 2

## 2017-01-23 MED ORDER — MAGNESIUM CITRATE PO SOLN: 1.0000 | Freq: Once | ORAL | Status: DC | PRN

## 2017-01-23 MED ORDER — OXYCODONE HCL 5 MG PO TABS
5.0000 mg | ORAL_TABLET | ORAL | Status: DC | PRN
  Administered 2017-01-23: 5 mg via ORAL

## 2017-01-23 MED ORDER — SODIUM CHLORIDE 0.9% FLUSH: 3.0000 mL | Freq: Two times a day (BID) | INTRAVENOUS | Status: DC

## 2017-01-23 MED ORDER — SUGAMMADEX SODIUM 200 MG/2ML IV SOLN
INTRAVENOUS | Status: DC | PRN
  Administered 2017-01-23: 200 mg via INTRAVENOUS

## 2017-01-23 MED ORDER — DOCUSATE SODIUM 100 MG PO CAPS
100.0000 mg | ORAL_CAPSULE | Freq: Two times a day (BID) | ORAL | Status: DC
  Administered 2017-01-23: 100 mg via ORAL
  Filled 2017-01-23: qty 1

## 2017-01-23 MED ORDER — POLYETHYLENE GLYCOL 3350 17 G PO PACK: 17.0000 g | PACK | Freq: Every day | ORAL | Status: DC | PRN

## 2017-01-23 MED ORDER — DEXAMETHASONE SODIUM PHOSPHATE 10 MG/ML IJ SOLN
INTRAMUSCULAR | Status: DC | PRN
  Administered 2017-01-23: 10 mg via INTRAVENOUS

## 2017-01-23 MED ORDER — MAGNESIUM OXIDE 400 (241.3 MG) MG PO TABS: 400.0000 mg | ORAL_TABLET | Freq: Every day | ORAL | Status: DC

## 2017-01-23 MED ORDER — FENTANYL CITRATE (PF) 250 MCG/5ML IJ SOLN
INTRAMUSCULAR | Status: DC | PRN
  Administered 2017-01-23: 50 ug via INTRAVENOUS
  Administered 2017-01-23: 100 ug via INTRAVENOUS
  Administered 2017-01-23: 50 ug via INTRAVENOUS

## 2017-01-23 MED ORDER — METHOCARBAMOL 500 MG PO TABS
500.0000 mg | ORAL_TABLET | Freq: Four times a day (QID) | ORAL | Status: DC | PRN
  Administered 2017-01-23 – 2017-01-24 (×2): 500 mg via ORAL
  Filled 2017-01-23: qty 1

## 2017-01-23 MED ORDER — ONDANSETRON HCL 4 MG/2ML IJ SOLN
INTRAMUSCULAR | Status: DC | PRN
  Administered 2017-01-23: 4 mg via INTRAVENOUS

## 2017-01-23 MED ORDER — FENTANYL CITRATE (PF) 100 MCG/2ML IJ SOLN
25.0000 ug | INTRAMUSCULAR | Status: DC | PRN
  Administered 2017-01-23 (×2): 50 ug via INTRAVENOUS

## 2017-01-23 MED ORDER — LISINOPRIL 20 MG PO TABS: 20.0000 mg | ORAL_TABLET | Freq: Every day | ORAL | Status: DC

## 2017-01-23 MED ORDER — CEFAZOLIN SODIUM-DEXTROSE 2-4 GM/100ML-% IV SOLN
2.0000 g | INTRAVENOUS | Status: AC
  Administered 2017-01-23: 2 g via INTRAVENOUS

## 2017-01-23 MED ORDER — LINACLOTIDE 145 MCG PO CAPS
290.0000 ug | ORAL_CAPSULE | Freq: Every day | ORAL | Status: DC
  Administered 2017-01-24: 290 ug via ORAL
  Filled 2017-01-23: qty 2

## 2017-01-23 MED ORDER — LISINOPRIL-HYDROCHLOROTHIAZIDE 20-12.5 MG PO TABS: 1.0000 | ORAL_TABLET | Freq: Every day | ORAL | Status: DC

## 2017-01-23 MED ORDER — FENTANYL CITRATE (PF) 100 MCG/2ML IJ SOLN
INTRAMUSCULAR | Status: AC
  Administered 2017-01-23: 50 ug via INTRAVENOUS
  Filled 2017-01-23: qty 2

## 2017-01-23 MED ORDER — ACETAMINOPHEN 10 MG/ML IV SOLN
INTRAVENOUS | Status: DC | PRN
  Administered 2017-01-23: 1000 mg via INTRAVENOUS

## 2017-01-23 MED ORDER — METHOCARBAMOL 500 MG PO TABS
ORAL_TABLET | ORAL | Status: AC
  Administered 2017-01-23: 21:00:00
  Filled 2017-01-23: qty 1

## 2017-01-23 MED ORDER — ROCURONIUM BROMIDE 10 MG/ML (PF) SYRINGE
PREFILLED_SYRINGE | INTRAVENOUS | Status: DC | PRN
  Administered 2017-01-23: 20 mg via INTRAVENOUS
  Administered 2017-01-23: 60 mg via INTRAVENOUS
  Administered 2017-01-23 (×3): 20 mg via INTRAVENOUS

## 2017-01-23 MED ORDER — 0.9 % SODIUM CHLORIDE (POUR BTL) OPTIME
TOPICAL | Status: DC | PRN
  Administered 2017-01-23: 1000 mL

## 2017-01-23 MED ORDER — ONDANSETRON 4 MG PO TBDP: 4.0000 mg | ORAL_TABLET | Freq: Three times a day (TID) | ORAL | 0 refills | Status: AC | PRN

## 2017-01-23 MED ORDER — CEFAZOLIN SODIUM-DEXTROSE 2-4 GM/100ML-% IV SOLN
INTRAVENOUS | Status: AC
  Filled 2017-01-23: qty 100

## 2017-01-23 MED ORDER — BUPIVACAINE-EPINEPHRINE (PF) 0.25% -1:200000 IJ SOLN
INTRAMUSCULAR | Status: AC
  Filled 2017-01-23: qty 30

## 2017-01-23 MED ORDER — SODIUM CHLORIDE 0.9% FLUSH: 3.0000 mL | INTRAVENOUS | Status: DC | PRN

## 2017-01-23 MED ORDER — PROPOFOL 10 MG/ML IV BOLUS
INTRAVENOUS | Status: DC | PRN
  Administered 2017-01-23: 160 mg via INTRAVENOUS

## 2017-01-23 MED ORDER — THROMBIN (RECOMBINANT) 5000 UNITS EX SOLR
CUTANEOUS | Status: AC
  Filled 2017-01-23: qty 5000

## 2017-01-23 MED ORDER — CEFAZOLIN SODIUM-DEXTROSE 2-4 GM/100ML-% IV SOLN
2.0000 g | Freq: Three times a day (TID) | INTRAVENOUS | Status: AC
  Administered 2017-01-23 – 2017-01-24 (×2): 2 g via INTRAVENOUS
  Filled 2017-01-23 (×2): qty 100

## 2017-01-23 MED ORDER — ONDANSETRON HCL 4 MG/2ML IJ SOLN
4.0000 mg | Freq: Four times a day (QID) | INTRAMUSCULAR | Status: DC | PRN
  Administered 2017-01-23: 4 mg via INTRAVENOUS
  Filled 2017-01-23: qty 2

## 2017-01-23 MED ORDER — HYDROCHLOROTHIAZIDE 12.5 MG PO CAPS: 12.5000 mg | ORAL_CAPSULE | Freq: Every day | ORAL | Status: DC

## 2017-01-23 MED ORDER — METHOCARBAMOL 1000 MG/10ML IJ SOLN
500.0000 mg | Freq: Four times a day (QID) | INTRAVENOUS | Status: DC | PRN
  Filled 2017-01-23: qty 5

## 2017-01-23 MED ORDER — LACTATED RINGERS IV SOLN
INTRAVENOUS | Status: DC
  Administered 2017-01-23: 50 mL/h via INTRAVENOUS

## 2017-01-23 MED ORDER — SODIUM CHLORIDE 0.9 % IV SOLN: 250.0000 mL | INTRAVENOUS | Status: DC

## 2017-01-23 SURGICAL SUPPLY — 70 items
BIT DRILL SKYLINE 12MM (BIT) ×1 IMPLANT
BLADE CLIPPER SURG (BLADE) IMPLANT
BONE VIVIGEN FORMABLE 1.3CC (Bone Implant) ×3 IMPLANT
BUR EGG ELITE 4.0 (BURR) IMPLANT
BUR EGG ELITE 4.0MM (BURR)
BUR MATCHSTICK NEURO 3.0 LAGG (BURR) IMPLANT
CANISTER SUCT 3000ML PPV (MISCELLANEOUS) ×3 IMPLANT
CLOSURE STERI-STRIP 1/2X4 (GAUZE/BANDAGES/DRESSINGS) ×1
CLSR STERI-STRIP ANTIMIC 1/2X4 (GAUZE/BANDAGES/DRESSINGS) ×2 IMPLANT
CORDS BIPOLAR (ELECTRODE) ×3 IMPLANT
COVER SURGICAL LIGHT HANDLE (MISCELLANEOUS) ×3 IMPLANT
CRADLE DONUT ADULT HEAD (MISCELLANEOUS) ×3 IMPLANT
DEVICE ENDSKLTN IMPL 16X14X7X6 (Cage) ×2 IMPLANT
DRAPE C-ARM 42X72 X-RAY (DRAPES) ×3 IMPLANT
DRAPE POUCH INSTRU U-SHP 10X18 (DRAPES) ×3 IMPLANT
DRAPE SURG 17X23 STRL (DRAPES) ×3 IMPLANT
DRAPE U-SHAPE 47X51 STRL (DRAPES) ×3 IMPLANT
DRILL BIT SKYLINE 12MM (BIT) ×2
DRSG MEPILEX BORDER 4X4 (GAUZE/BANDAGES/DRESSINGS) IMPLANT
DRSG OPSITE POSTOP 4X6 (GAUZE/BANDAGES/DRESSINGS) ×3 IMPLANT
DURAPREP 26ML APPLICATOR (WOUND CARE) ×3 IMPLANT
ELECT COATED BLADE 2.86 ST (ELECTRODE) ×3 IMPLANT
ELECT PENCIL ROCKER SW 15FT (MISCELLANEOUS) ×3 IMPLANT
ELECT REM PT RETURN 9FT ADLT (ELECTROSURGICAL) ×3
ELECTRODE REM PT RTRN 9FT ADLT (ELECTROSURGICAL) ×1 IMPLANT
ENDOSKELETON IMPLANT 16X14X7X6 (Cage) ×6 IMPLANT
FLOSEAL 10ML (HEMOSTASIS) ×3 IMPLANT
GLOVE BIO SURGEON STRL SZ 6.5 (GLOVE) ×2 IMPLANT
GLOVE BIO SURGEONS STRL SZ 6.5 (GLOVE) ×1
GLOVE BIOGEL PI IND STRL 6.5 (GLOVE) ×1 IMPLANT
GLOVE BIOGEL PI IND STRL 8.5 (GLOVE) ×1 IMPLANT
GLOVE BIOGEL PI INDICATOR 6.5 (GLOVE) ×2
GLOVE BIOGEL PI INDICATOR 8.5 (GLOVE) ×2
GLOVE SS BIOGEL STRL SZ 8.5 (GLOVE) ×1 IMPLANT
GLOVE SUPERSENSE BIOGEL SZ 8.5 (GLOVE) ×2
GOWN STRL REUS W/ TWL XL LVL3 (GOWN DISPOSABLE) ×2 IMPLANT
GOWN STRL REUS W/TWL 2XL LVL3 (GOWN DISPOSABLE) ×6 IMPLANT
GOWN STRL REUS W/TWL XL LVL3 (GOWN DISPOSABLE) ×4
KIT BASIN OR (CUSTOM PROCEDURE TRAY) ×3 IMPLANT
KIT ROOM TURNOVER OR (KITS) ×3 IMPLANT
NEEDLE HYPO 22GX1.5 SAFETY (NEEDLE) ×3 IMPLANT
NEEDLE SPNL 18GX3.5 QUINCKE PK (NEEDLE) ×3 IMPLANT
NS IRRIG 1000ML POUR BTL (IV SOLUTION) ×3 IMPLANT
PACK ORTHO CERVICAL (CUSTOM PROCEDURE TRAY) ×3 IMPLANT
PACK UNIVERSAL I (CUSTOM PROCEDURE TRAY) ×3 IMPLANT
PAD ARMBOARD 7.5X6 YLW CONV (MISCELLANEOUS) ×6 IMPLANT
PATTIES SURGICAL .25X.25 (GAUZE/BANDAGES/DRESSINGS) IMPLANT
PIN DISTRACTION 14 (PIN) ×6 IMPLANT
PLATE SKYLINE TWO LEVEL 36MM (Plate) ×3 IMPLANT
RESTRAINT LIMB HOLDER UNIV (RESTRAINTS) ×3 IMPLANT
SCREW SELF DRILL SKYLINE 12MM (Screw) ×6 IMPLANT
SCREW SKYLINE 14MM SD-VA (Screw) ×9 IMPLANT
SCREW SKYLINE 16MM (Screw) ×3 IMPLANT
SPONGE INTESTINAL PEANUT (DISPOSABLE) ×3 IMPLANT
SPONGE LAP 4X18 X RAY DECT (DISPOSABLE) ×6 IMPLANT
SPONGE SURGIFOAM ABS GEL 100 (HEMOSTASIS) ×3 IMPLANT
SURGIFLO W/THROMBIN 8M KIT (HEMOSTASIS) ×3 IMPLANT
SUT BONE WAX W31G (SUTURE) ×3 IMPLANT
SUT MON AB 3-0 SH 27 (SUTURE) ×2
SUT MON AB 3-0 SH27 (SUTURE) ×1 IMPLANT
SUT SILK 2 0 (SUTURE)
SUT SILK 2-0 18XBRD TIE 12 (SUTURE) IMPLANT
SUT VIC AB 2-0 CT1 18 (SUTURE) ×3 IMPLANT
SYR BULB IRRIGATION 50ML (SYRINGE) ×3 IMPLANT
SYR CONTROL 10ML LL (SYRINGE) ×3 IMPLANT
TAPE CLOTH 4X10 WHT NS (GAUZE/BANDAGES/DRESSINGS) ×3 IMPLANT
TAPE UMBILICAL COTTON 1/8X30 (MISCELLANEOUS) ×3 IMPLANT
TOWEL OR 17X24 6PK STRL BLUE (TOWEL DISPOSABLE) ×3 IMPLANT
TOWEL OR 17X26 10 PK STRL BLUE (TOWEL DISPOSABLE) ×3 IMPLANT
WATER STERILE IRR 1000ML POUR (IV SOLUTION) IMPLANT

## 2017-01-23 NOTE — Discharge Instructions (Signed)
Cervical Fusion, Care After °This sheet gives you information about how to care for yourself after your procedure. Your health care provider may also give you more specific instructions. If you have problems or questions, contact your health care provider. °What can I expect after the procedure? °After the procedure, it is common to have: °· Incision area pain. °· Numbness. °· Weakness. °· Sore throat. °· Difficulty swallowing. ° °Follow these instructions at home: °Medicines °· Take over-the-counter and prescription medicines, including pain medicines, only as told by your health care provider. °· If you were prescribed an antibiotic medicine, take it as told by your health care provider. Do not stop taking the antibiotic even if you start to feel better. °If you have a brace: °· Wear the brace as told by your health care provider. Remove it only as told by your health care provider. °· Keep the brace clean. °Incision care °· Follow instructions from your health care provider about how to take care of your incision. Make sure you: °? Wash your hands with soap and water before you change your bandage (dressing). If soap and water are not available, use hand sanitizer. °? Change your dressing as told by your health care provider. °? Leave stitches (sutures), skin glue, or adhesive strips in place. These skin closures may need to be in place for 2 weeks or longer. If adhesive strip edges start to loosen and curl up, you may trim the loose edges. Do not remove adhesive strips completely unless your health care provider tells you to do that. °· Keep your incision clean and dry. Do not take baths, swim, or use a hot tub until your health care provider approves. °· Check your incision area every day for signs of infection. Check for: °? More redness, swelling, or pain. °? More fluid or blood. °? Warmth. °? Pus or a bad smell. °Activity ° °· Rest and protect your back as much as possible. °· Do not lift anything that is  heavier than 10 lb (4.5 kg) or the limit that you are told by your health care provider. °· Do not twist or bend at the waist until your health care provider approves. °· Avoid: °? Pushing and pulling motions. °? Lifting anything over your head. °? Sitting or lying down in the same position for long periods of time. °· Do not exercise until your health care provider approves. Once your health care provider has approved exercise, ask him or her what kinds of exercises you can do to make your back stronger (physical therapy). °· Do not drive until your health care provider approves. °? Do not drive for 24 hours if you received a medicine to help you relax (sedative). °? Do not drive or use heavy machinery while taking prescription pain medicine. °General instructions ° °· Have someone assist you to turn in bed frequently by moving your whole body without twisting your back (log rolling technique). °· Wear compression stockings as told by your health care provider. These stockings help to prevent blood clots and reduce swelling in your legs. °· Do not use any products that contain nicotine or tobacco, such as cigarettes and e-cigarettes. These can delay bone healing. If you need help quitting, ask your health care provider. °· To prevent or treat constipation while you are taking prescription pain medicine, your health care provider may recommend that you: °? Drink enough fluid to keep your urine clear or pale yellow. °? Take over-the-counter or prescription medicines. °? Eat foods   that are high in fiber, such as fresh fruits and vegetables, whole grains, and beans. °? Limit foods that are high in fat and processed sugars, such as fried and sweet foods. °· Keep all follow-up visits as told by your health care provider. This is important. °Contact a health care provider if: °· You have pain that gets worse or does not get better with medicine. °· You have more redness, swelling, or pain around your incision. °· You have  more fluid or blood coming from your incision. °· Your incision feels warm to the touch. °· You have pus or a bad smell coming from your incision. °· You have a fever. °· You have weakness or numbness in your legs that is new or getting worse. °· You have swelling in your calf or leg. °· The edges of your incision break open. °· You vomit or feel nauseous. °· You have trouble controlling urination or bowel movements. °Get help right away if: °· You develop shortness of breath or chest pain. °· You have trouble swallowing. °· You have trouble breathing. °· You develop a cough. °This information is not intended to replace advice given to you by your health care provider. Make sure you discuss any questions you have with your health care provider. °Document Released: 08/30/2003 Document Revised: 08/10/2015 Document Reviewed: 07/27/2015 °Elsevier Interactive Patient Education © 2018 [employer] ° ° ° ° ° ° °

## 2017-01-23 NOTE — H&P (Signed)
History of Present Illness  The patient is a 51 year old male who comes in today for a preoperative History and Physical. The patient is scheduled for a ACDF C5-7 to be performed by Dr. Debria Garretahari D. Shon BatonBrooks, [facility] . Pt reports irregular hear beats (PVCs). The pt reports vaping ecigarettes. He reports no trouble of fusion from his last thumb surgery. No intension of stopping.  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 Orientation-Oriented X3. Build & Nutrition-Well nourished and Well developed.  Integumentary General Characteristics Surgical Scars - no surgical scar evidence of previous cervical surgery. Cervical Spine-Skin examination of the cervical spine is without deformity, skin lesions, lacerations or abrasions.  Chest and Lung Exam Auscultation Breath sounds - Normal and Clear.  Cardiovascular Auscultation Rhythm - Regular rate and  rhythm.  Peripheral Vascular Upper Extremity Palpation - Radial pulse - Bilateral - 2+.  Neurologic Sensation Upper Extremity - Left - sensation is intact in the upper extremity. Right - sensation is diminished in the upper extremity. Reflexes Biceps Reflex - Bilateral - 2+. Brachioradialis Reflex - Bilateral - 2+. Triceps Reflex - Bilateral - 2+. Hoffman's Sign - Bilateral - Hoffman's sign not present.  Musculoskeletal Spine/Ribs/Pelvis  Cervical Spine : Inspection and Palpation - Tenderness - right cervical paraspinals tender to palpation, left cervical paraspinals tender to palpation, right trapezius tender to palpation and left trapezius tender to palpation. Strength and Tone: Strength - Deltoid - Bilateral - 5/5. Biceps - Bilateral - 5/5. Triceps - Bilateral - 5/5. Wrist Extension - Bilateral - 5/5. Hand Grip - Bilateral - 5/5. Heel walk - Bilateral - able to heel walk without difficulty. Toe Walk - Bilateral - able to walk on toes without difficulty. Heel-Toe Walk - Bilateral - able to heel-toe walk without difficulty. ROM - Flexion - Moderately Decreased and painful. Extension - Moderately Decreased and painful. Left Lateral Flexion - Moderately Decreased and painful. Right Lateral Flexion - Moderately Decreased and painful. Left Rotation - Moderately Decreased and painful. Right Rotation - Moderately Decreased. Pain - neither flexion or extension is more painful than the other. Cervical Spine - Special Testing - axial compression test negative, cross chest impingement test negative. Non-Anatomic Signs - No non-anatomic signs present. Upper Extremity Range of Motion - No truesholder pain with IR/ER of the shoulders.  Patient's last MRI September 2018 C5-6 osteophyte complex effacing the ventral CSF secondary to ligamentum flavum enfolding which results in moderate central stenosis with severe right moderate left foraminal stenosis, C6-7 positive for moderate posterior disc osteophyte complex  nearly completely effacing the ventral CSF with partial effacement of the dorsal CSF secondary  to ligamentum flavum enfolding mild central stenosis moderate facet disease severe right moderate left foraminal stenosis.  Assessment & Plan  Patient continues to have significant radicular arm pain bilaterally (right greater than left).  Despite appropriate conservative management his quality of life continues to deteriorate.  He is having increasing dysesthesias in the C6, and C7 distributions.  At this point time because of the failure of conservative management we have elected to move forward with a 2 level ACDF.  I have reviewed the condition, and case with the patient.  I have also reviewed the risks as listed below.  All of their questions were encouraged and addressed.  Goal Of Surgery: Discussed that goal of surgery is to reduce pain and improve function and quality of life. Patient is aware that despite all appropriate treatment that there pain and function could be the same, worse, or different.  Anterior cervical fusion:Risks of surgery include, but are not limited to: Throat pain, swallowing difficulty, hoarseness or change in voice, death, stroke, paralysis, nerve root damage/injury, bleeding, blood clots, loss of bowel/bladder control, hardware failure, or mal-position, spinal fluid leak, adjacent segment disease, non-union, need for further surgery, ongoing or worse pain, infection. Post-operative bleeding or swelling that could require emergent surgery.

## 2017-01-23 NOTE — Anesthesia Procedure Notes (Signed)
Procedure Name: Intubation Date/Time: 01/23/2017 3:48 PM Performed by: Teressa Lower., CRNA Pre-anesthesia Checklist: Patient identified, Emergency Drugs available, Suction available and Patient being monitored Patient Re-evaluated:Patient Re-evaluated prior to induction Oxygen Delivery Method: Circle system utilized Preoxygenation: Pre-oxygenation with 100% oxygen Induction Type: IV induction Ventilation: Mask ventilation without difficulty Laryngoscope Size: Mac and 4 Grade View: Grade II Tube type: Oral Tube size: 7.5 mm Number of attempts: 1 Airway Equipment and Method: Stylet and Oral airway Placement Confirmation: ETT inserted through vocal cords under direct vision,  positive ETCO2 and breath sounds checked- equal and bilateral Secured at: 22 cm Tube secured with: Tape Dental Injury: Teeth and Oropharynx as per pre-operative assessment

## 2017-01-23 NOTE — Anesthesia Preprocedure Evaluation (Addendum)
Anesthesia Evaluation  Patient identified by MRN, date of birth, ID band Patient awake    Reviewed: Allergy & Precautions, H&P , Patient's Chart, lab work & pertinent test results, reviewed documented beta blocker date and time   Airway Mallampati: II  TM Distance: >3 FB Neck ROM: full    Dental no notable dental hx.    Pulmonary former smoker,    Pulmonary exam normal breath sounds clear to auscultation       Cardiovascular hypertension,  Rhythm:regular Rate:Normal     Neuro/Psych    GI/Hepatic   Endo/Other    Renal/GU      Musculoskeletal   Abdominal   Peds  Hematology   Anesthesia Other Findings   Reproductive/Obstetrics                            Anesthesia Physical Anesthesia Plan  ASA: III  Anesthesia Plan: General   Post-op Pain Management:    Induction: Intravenous  PONV Risk Score and Plan:   Airway Management Planned: Oral ETT  Additional Equipment:   Intra-op Plan:   Post-operative Plan: Extubation in OR  Informed Consent: I have reviewed the patients History and Physical, chart, labs and discussed the procedure including the risks, benefits and alternatives for the proposed anesthesia with the patient or authorized representative who has indicated his/her understanding and acceptance.   Dental Advisory Given  Plan Discussed with: CRNA and Surgeon  Anesthesia Plan Comments: (  )       Anesthesia Quick Evaluation

## 2017-01-23 NOTE — Transfer of Care (Signed)
Immediate Anesthesia Transfer of Care Note  Patient: Phillip Daugherty  Procedure(s) Performed: ANTERIOR CERVICAL DISCECTOMY AND FUSION CERVICAL FIVE THROUGH SEVEN (N/A Spine Cervical)  Patient Location: PACU  Anesthesia Type:General  Level of Consciousness: awake, alert  and oriented  Airway & Oxygen Therapy: Patient Spontanous Breathing and Patient connected to face mask oxygen  Post-op Assessment: Report given to RN and Post -op Vital signs reviewed and stable  Post vital signs: Reviewed and stable  Last Vitals:  Vitals:   01/23/17 1051  BP: 129/78  Pulse: 68  Resp: 20  Temp: 36.8 C  SpO2: 100%    Last Pain:  Vitals:   01/23/17 1107  TempSrc:   PainSc: 7       Patients Stated Pain Goal: 4 (01/23/17 1107)  Complications: No apparent anesthesia complications

## 2017-01-23 NOTE — Op Note (Signed)
Operative report.  Preoperative diagnosis.  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  Postoperative diagnosis.  Same.  Operative procedure anterior cervical discectomy and fusion (ACDF) C5-C7.  First Assistant [place], [place]  Complications.  None.  Implants used: Titan Nano lock 7 mm medium lordotic cages x2.  Allograft: vivogen.  Depuy anterior cervical skyline plate.  36 mm length.  Affixed with 14 mm screwsx3, 12 mm screws x2, and 16 mm screw x1  Indications.  This is a pleasant 51 year old male who presents with significant neck and neuropathic arm pain right worse than the left.  Attempts at conservative management failed to alleviate his symptoms.  As a result imaging studies confirmed lateral recess and foraminal stenosis with nerve irritation.  Due to the failure of conservative management we elected to move forward with surgery.  All appropriate risks benefits and alternatives were discussed and consent was obtained.  Operative note.  Patient was brought to the operating room placed upon the operating room table.  After successful induction of general anesthesia and endotracheal intubation teds SCDs were applied and the anterior cervical spine was prepped and draped in a standard fashion.  Timeout was taken to confirm patient, procedure, and all other important data.  X-ray was used to identify the C6 vertebral body and this was marked on the skin.  I infiltrated the transverse incision with quarter percent Marcaine with epinephrine.  The midline incision was made and sharp dissection was carried out down to the platysma.  The platysma was isolated and sacrificed.  I continued sharply dissecting along the medial border the sternocleidomastoid performing a standard Smith-Robinson approach to the anterior cervical spine.  Sharply dissected down the medial border until I could visualize the carotid sheath as well as the esophagus.  I then bluntly dissected in this plane sweeping  the esophagus to the right and protecting it with a retractor.  The identifying and protecting the carotid sheath laterally with my finger.  I then used Kitner dissectors to complete my dissection through the prevertebral fascia and exposed the anterior longitudinal ligament from the mid body of C5 to the mid body of C7.  A needle was placed into the C5/6 disc space and intraoperative x-ray was taken to confirm that I was at the appropriate level.  At this point I then mobilized the paraspinal muscles from the mid body of C5 to the mid body of C7.  I then placed my self-retaining retractor underneath the longus coli muscle deflated the endotracheal cuff to expand the retractor and then reinflated the endotracheal cuff.  An annulotomy was performed at the C6-7 disc space and I bluntly remove the bulk of the disc material with a pituitary rongeur.  I then took down the overhanging osteophyte from the inferior aspect of the C6 vertebral body with a 2 mm Kerrison punch.  Distraction pins were then placed into the body of C6 and C7 and I distracted the intervertebral space and maintain that with the distraction pins.  There was significant collapse of the disc space.  There is also slight retrolisthesis of C6 with respect to C7.  I then used my curettes to remove the posterior annulus and then my 1 mm Kerrison to resect the posterior osteophyte from the vertebral bodies of C6 and C7.  Once this was done I could get my nerve hook underneath the posterior annulus and release this from the vertebral bodies.  I then took that down with my 1 mm Kerrison.  This allowed me  get under the uncovertebral joints and decompress them as well.  At this point time I was able to obtain parallel distraction of the intervertebral space.  I had excellent decompression of the lateral recess and uncovertebral joints.  At this point I was pleased with my discectomy.  I measured and placed the appropriate size cage packed with the  allograft.  I got excellent purchase and it was secure in the space.  At this point I repositioned my guidepins and disc retractors to expose the C5-6 disc space.  Using the same technique I just utilized at C6-7 I performed a discectomy at the C5-6 level.  Again I released the annulus posteriorly, resected the posterior osteophytes and undercut the uncovertebral joints to adequately decompress the thecal sac and the foramen.  At this point I then measured and placed a 7mm medium lordotic cage packed with allograft.  Again I had excellent fixation.  Once the graft was properly seated I removed all the guidepins and made sure hemostasis using bipolar electrocautery and FloSeal.  I then contoured an anterior cervical plate and applied it to the anterior cervical bodies and affixed it with self drilling screws.  All screws had excellent purchase.  Final x-rays were satisfactory and the screws were secured to the plate using standard main fracture protocol.  I then made sure that the esophagus was free from the plate by checking that the lateral border.  At this point I placed FloSeal and irrigated copiously with normal saline and made sure hemostasis.  At this point with the ACF complete I then returned the trachea and esophagus to midline closed the platysma with interrupted 2-0 Vicryl sutures and the skin with 3-0 Monocryl.  Steri-Strips dry dressings were applied and the patient was ultimately extubated transferred the PACU without incident.  In the case all needle sponge counts were correct.

## 2017-01-23 NOTE — Brief Op Note (Signed)
01/23/2017  6:46 PM  PATIENT:  Phillip Daugherty  51 y.o. male  PRE-OPERATIVE DIAGNOSIS:  C5-C7 degenerative facet arthritis  POST-OPERATIVE DIAGNOSIS:  C5-C7 degenerative facet arthritis  PROCEDURE:  Procedure(s) with comments: ANTERIOR CERVICAL DISCECTOMY AND FUSION CERVICAL FIVE THROUGH SEVEN (N/A) - 3.5 hrs  SURGEON:  Surgeon(s) and Role:    Venita Lick* Whitfield Dulay, MD - Primary  PHYSICIAN ASSISTANT:   ASSISTANTS: Carmen Mayo   ANESTHESIA:   general  EBL:  20 mL   BLOOD ADMINISTERED:none  DRAINS: none   LOCAL MEDICATIONS USED:  MARCAINE     SPECIMEN:  No Specimen  DISPOSITION OF SPECIMEN:  N/A  COUNTS:  YES  TOURNIQUET:  * No tourniquets in log *  DICTATION: .Dragon Dictation  PLAN OF CARE: Admit for overnight observation  PATIENT DISPOSITION:  PACU - hemodynamically stable.

## 2017-01-23 NOTE — Anesthesia Postprocedure Evaluation (Signed)
Anesthesia Post Note  Patient: Phillip Daugherty  Procedure(s) Performed: ANTERIOR CERVICAL DISCECTOMY AND FUSION CERVICAL FIVE THROUGH SEVEN (N/A Spine Cervical)     Patient location during evaluation: PACU Anesthesia Type: General Level of consciousness: awake and alert Pain management: pain level controlled Vital Signs Assessment: post-procedure vital signs reviewed and stable Respiratory status: spontaneous breathing, nonlabored ventilation and respiratory function stable Cardiovascular status: blood pressure returned to baseline and stable Postop Assessment: no apparent nausea or vomiting Anesthetic complications: no    Last Vitals:  Vitals:   01/23/17 2000 01/23/17 2011  BP: 127/71 127/63  Pulse: 80 82  Resp: 12 18  Temp:  36.9 C  SpO2: 96% 97%    Last Pain:  Vitals:   01/23/17 2000  TempSrc:   PainSc: 4     LLE Motor Response: Purposeful movement;Responds to commands (01/23/17 2011) LLE Sensation: Full sensation (01/23/17 2011) RLE Motor Response: Purposeful movement;Responds to commands (01/23/17 2011) RLE Sensation: Full sensation (01/23/17 2011)      Forney Kleinpeter,W. EDMOND

## 2017-01-24 ENCOUNTER — Encounter (HOSPITAL_COMMUNITY): Payer: Self-pay | Admitting: Orthopedic Surgery

## 2017-01-24 DIAGNOSIS — M50122 Cervical disc disorder at C5-C6 level with radiculopathy: Secondary | ICD-10-CM | POA: Diagnosis not present

## 2017-01-24 NOTE — Evaluation (Signed)
Physical Therapy Evaluation and Discharge Patient Details Name: Phillip Daugherty MRN: [id number] DOB: 05/31/1965 Today's Date: 01/24/2017   History of Present Illness  Pt is a 51 y.o. male s/p C5-7 ACDF. PMHx: Carpal tunnel syndrome bil wrist, R elbow pain, SI joint pain, L knee pain.  Clinical Impression  Patient evaluated by Physical Therapy with no further acute PT needs identified. All education has been completed and the patient has no further questions. At the time of PT eval pt was able to perform transfers and ambulation with gross modified independence. Pt and wife were educated in precautions, walking program, brace application and wearing schedule, and safe activity progression. See below for any follow-up Physical Therapy or equipment needs. PT is signing off. Thank you for this referral.     Follow Up Recommendations No PT follow up;Supervision - Intermittent    Equipment Recommendations  None recommended by PT    Recommendations for Other Services       Precautions / Restrictions Precautions Precautions: Cervical Precaution Comments: Educated pt on cervical precautions and brace management. Required Braces or Orthoses: Cervical Brace Cervical Brace: Hard collar Restrictions Weight Bearing Restrictions: No      Mobility  Bed Mobility               General bed mobility comments: Verbally reviewed log roll technique. Pt was sitting EOB when PT arrived.   Transfers Overall transfer level: Modified independent Equipment used: None             General transfer comment: No unsteadiness or LOB noted.   Ambulation/Gait Ambulation/Gait assistance: Modified independent (Device/Increase time) Ambulation Distance (Feet): 400 Feet Assistive device: None Gait Pattern/deviations: WFL(Within Functional Limits)   Gait velocity interpretation: at or above normal speed for age/gender General Gait Details: Pt ambulating well with no unsteadiness noted.   Stairs          General stair comments: Pt declined to practice stairs, however we discussed general sequencing and safety with the 3 stairs he has to enter his home.   Wheelchair Mobility    Modified Rankin (Stroke Patients Only)       Balance Overall balance assessment: No apparent balance deficits (not formally assessed)                                           Pertinent Vitals/Pain Pain Assessment: Faces Faces Pain Scale: Hurts a little bit Pain Location: neck Pain Descriptors / Indicators: Sore Pain Intervention(s): Limited activity within patient's tolerance;Monitored during session;Repositioned    Home Living Family/patient expects to be discharged to:: Private residence Living Arrangements: Spouse/significant other Available Help at Discharge: Family;Available PRN/intermittently(24 hours through the weekend) Type of Home: House Home Access: Stairs to enter   [employer] CorporationEntrance Stairs-Number of Steps: 3 Home Layout: One level Home Equipment: Shower seat - built in      Prior Function Level of Independence: Independent               Hand Dominance        Extremity/Trunk Assessment   Upper Extremity Assessment Upper Extremity Assessment: Defer to OT evaluation    Lower Extremity Assessment Lower Extremity Assessment: Overall WFL for tasks assessed    Cervical / Trunk Assessment Cervical / Trunk Assessment: Other exceptions Cervical / Trunk Exceptions: s/p ACDF  Communication   Communication: No difficulties  Cognition Arousal/Alertness: Awake/alert Behavior During Therapy: [facility] for  tasks assessed/performed Overall Cognitive Status: Within Functional Limits for tasks assessed                                        General Comments      Exercises     Assessment/Plan    PT Assessment Patent does not need any further PT services  PT Problem List         PT Treatment Interventions      PT Goals (Current goals can be found  in the Care Plan section)  Acute Rehab PT Goals Patient Stated Goal: return to independence PT Goal Formulation: All assessment and education complete, DC therapy    Frequency     Barriers to discharge        Co-evaluation               AM-PAC PT "6 Clicks" Daily Activity  Outcome Measure Difficulty turning over in bed (including adjusting bedclothes, sheets and blankets)?: None Difficulty moving from lying on back to sitting on the side of the bed? : None Difficulty sitting down on and standing up from a chair with arms (e.g., wheelchair, bedside commode, etc,.)?: None Help needed moving to and from a bed to chair (including a wheelchair)?: None Help needed walking in hospital room?: None Help needed climbing 3-5 steps with a railing? : None 6 Click Score: 24    End of Session   Activity Tolerance: Patient tolerated treatment well Patient left: with call bell/phone within reach;with family/visitor present(Sitting EOB) Nurse Communication: Mobility status PT Visit Diagnosis: Pain;Other symptoms and signs involving the nervous system (R29.898) Pain - part of body: (Neck)    Time: [id number] PT Time Calculation (min) (ACUTE ONLY): 14 min   Charges:   PT Evaluation $PT Eval Low Complexity: 1 Low     PT G Codes:   PT G-Codes **NOT FOR INPATIENT CLASS** Functional Assessment Tool Used: Clinical judgement Functional Limitation: Mobility: Walking and moving around Mobility: Walking and Moving Around Current Status ([id number](G8978): At least 80 percent but less than 100 percent impaired, limited or restricted Mobility: Walking and Moving Around Goal Status [phone](G8979): At least 80 percent but less than 100 percent impaired, limited or restricted Mobility: Walking and Moving Around Discharge Status [phone](G8980): At least 80 percent but less than 100 percent impaired, limited or restricted    Conni SlipperLaura Cristobal Advani, PT, DPT Acute Rehabilitation Services Pager: [phone]   Marylynn PearsonLaura D  Alia Parsley 01/24/2017, 8:19 AM

## 2017-01-24 NOTE — Evaluation (Signed)
Occupational Therapy Evaluation and Discharge Patient Details Name: Phillip Daugherty MRN: [id number] DOB: 12/04/1965 Today's Date: 01/24/2017    History of Present Illness Pt is a 51 y.o. male s/p C5-7 ACDF. PMHx: Carpal tunnel syndrome bil wrist, R elbow pain, SI joint pain, L knee pain.   Clinical Impression   Pt reports he was independent with ADL PTA. Currently pt mod I with ADL and functional mobility. All neck, safety, and ADL education completed with pt. Pt planning to d/c home with 24/7 supervision from family initially. No further acute OT needs identified; signing off at this time. Please re-consult if needs change. Thank you for this referral.    Follow Up Recommendations  No OT follow up;Supervision - Intermittent    Equipment Recommendations  None recommended by OT    Recommendations for Other Services       Precautions / Restrictions Precautions Precautions: Cervical Precaution Comments: Educated pt on cervical precautions and brace management. Required Braces or Orthoses: Cervical Brace Restrictions Weight Bearing Restrictions: No      Mobility Bed Mobility               General bed mobility comments: Pt OOB in chair upon arrival  Transfers Overall transfer level: Modified independent Equipment used: None                  Balance Overall balance assessment: No apparent balance deficits (not formally assessed)                                         ADL either performed or assessed with clinical judgement   ADL Overall ADL's : Modified independent                                       General ADL Comments: Educated pt on use of 2 cups for oral care, brace management and wear, compensatory strategies for ADL, supervision for safety with shower transfer initially.     Vision         Perception     Praxis      Pertinent Vitals/Pain Pain Assessment: Faces Faces Pain Scale: Hurts a little bit Pain  Location: neck Pain Descriptors / Indicators: Sore Pain Intervention(s): Monitored during session     Hand Dominance     Extremity/Trunk Assessment Upper Extremity Assessment Upper Extremity Assessment: Overall WFL for tasks assessed   Lower Extremity Assessment Lower Extremity Assessment: Defer to PT evaluation   Cervical / Trunk Assessment Cervical / Trunk Assessment: Other exceptions Cervical / Trunk Exceptions: s/p ACDF   Communication Communication Communication: No difficulties   Cognition Arousal/Alertness: Awake/alert Behavior During Therapy: WFL for tasks assessed/performed Overall Cognitive Status: Within Functional Limits for tasks assessed                                     General Comments       Exercises     Shoulder Instructions      Home Living Family/patient expects to be discharged to:: Private residence Living Arrangements: Spouse/significant other Available Help at Discharge: Family;Available PRN/intermittently(initially) Type of Home: House Home Access: Stairs to enter [employer] CorporationEntrance Stairs-Number of Steps: 3   Home Layout: One level     Bathroom Shower/Tub:  Walk-in shower   Bathroom Toilet: Handicapped height     Home Equipment: Shower seat - built in          Prior Functioning/Environment Level of Independence: Independent                 OT Problem List:        OT Treatment/Interventions:      OT Goals(Current goals can be found in the care plan section) Acute Rehab OT Goals Patient Stated Goal: return to independence OT Goal Formulation: All assessment and education complete, DC therapy  OT Frequency:     Barriers to D/C:            Co-evaluation              AM-PAC PT "6 Clicks" Daily Activity     Outcome Measure Help from another person eating meals?: None Help from another person taking care of personal grooming?: None Help from another person toileting, which includes using toliet,  bedpan, or urinal?: None Help from another person bathing (including washing, rinsing, drying)?: None Help from another person to put on and taking off regular upper body clothing?: None Help from another person to put on and taking off regular lower body clothing?: None 6 Click Score: 24   End of Session Equipment Utilized During Treatment: Cervical collar Nurse Communication: Mobility status;Other (comment)(no equipment or f/u needs)  Activity Tolerance: Patient tolerated treatment well Patient left: with family/visitor present(standing in room)  OT Visit Diagnosis: Pain Pain - part of body: (neck)                Time: [id number] OT Time Calculation (min): 14 min Charges:  OT General Charges $OT Visit: 1 Visit OT Evaluation $OT Eval Low Complexity: 1 Low G-Codes: OT G-codes **NOT FOR INPATIENT CLASS** Functional Assessment Tool Used: AM-PAC 6 Clicks Daily Activity Functional Limitation: Self care Self Care Current Status ([id number](G8987): 0 percent impaired, limited or restricted Self Care Goal Status (A2130(G8988): 0 percent impaired, limited or restricted Self Care Discharge Status (Q6578(G8989): 0 percent impaired, limited or restricted   Fredric MareBailey A. Brett Albinooffey, M.S., OTR/L Pager: [phone]  Gaye AlkenBailey A Mariann Palo 01/24/2017, 7:57 AM

## 2017-01-24 NOTE — Progress Notes (Signed)
    Subjective: 1 Day Post-Op Procedure(s) (LRB): ANTERIOR CERVICAL DISCECTOMY AND FUSION CERVICAL FIVE THROUGH SEVEN (N/A) Patient reports pain as 3 on 0-10 scale.   Denies CP or SOB.  Voiding without difficulty. Positive flatus. Pt has been ambulating in hall way.  Objective: Vital signs in last 24 hours: Temp:  [98.2 F (36.8 C)-98.7 F (37.1 C)] 98.5 F (36.9 C) (12/27 0400) Pulse Rate:  [68-91] 77 (12/27 0400) Resp:  [12-20] 20 (12/27 0400) BP: (119-136)/(58-78) 128/73 (12/27 0400) SpO2:  [96 %-100 %] 99 % (12/27 0400) Weight:  [71.2 kg (157 lb)] 71.2 kg (157 lb) (12/26 1051)  Intake/Output from previous day: 12/26 0701 - 12/27 0700 In: 315 [P.O.:240] Out: 20 [Blood:20] Intake/Output this shift: No intake/output data recorded.  Labs: No results for input(s): HGB in the last 72 hours. No results for input(s): WBC, RBC, HCT, PLT in the last 72 hours. No results for input(s): NA, K, CL, CO2, BUN, CREATININE, GLUCOSE, CALCIUM in the last 72 hours. No results for input(s): LABPT, INR in the last 72 hours.  Physical Exam: Neurologically intact ABD soft Sensation intact distally Dorsiflexion/Plantar flexion intact Incision: no drainage Compartment soft Aspen collar in place  Assessment/Plan: 1 Day Post-Op Procedure(s) (LRB): ANTERIOR CERVICAL DISCECTOMY AND FUSION CERVICAL FIVE THROUGH SEVEN (N/A) Advance diet Up with therapy  Pt may DC home after cleared by PT  May[place], Baxter Kailarmen Christina for Dr. Venita Lickahari Brooks [facility] [phone] 01/24/2017, 7:22 AM

## 2017-01-24 NOTE — Discharge Summary (Signed)
Physician Discharge Summary  Patient ID: Phillip Daugherty MRN: [id number] DOB/AGE: 53/05/1965 53 y.o.  Admit date: 01/23/2017 Discharge date: 01/24/2017  Admission Diagnoses:  Cervical DDD  Discharge Diagnoses:  Active Problems:   S/P cervical spinal fusion   Past Medical History:  Diagnosis Date  . Arthritis   . Dysrhythmia    Hx PVCs  . High cholesterol   . Hypertension     Surgeries: Procedure(s): ANTERIOR CERVICAL DISCECTOMY AND FUSION CERVICAL FIVE THROUGH SEVEN on 01/23/2017   Consultants (if any):   Discharged Condition: Improved  Hospital Course: Phillip Daugherty is an 51 y.o. male who was admitted 01/23/2017 with a diagnosis of Cervical DDD and went to the operating room on 01/23/2017 and underwent the above named procedures.  Pt is urinating w/o difficulty.  Pt has been ambulating in hallway. Pt cleared by PT for DC.   He was given perioperative antibiotics:  Anti-infectives (From admission, onward)   Start     Dose/Rate Route Frequency Ordered Stop   01/24/17 0000  ceFAZolin (ANCEF) IVPB 2g/100 mL premix     2 g 200 mL/hr over 30 Minutes Intravenous Every 8 hours 01/23/17 2026 01/24/17 0650   01/23/17 1051  ceFAZolin (ANCEF) 2-4 GM/100ML-% IVPB    Comments:  Ray ChurchBowman, Jennifer   : cabinet override      01/23/17 1051 01/23/17 1556   01/23/17 1049  ceFAZolin (ANCEF) IVPB 2g/100 mL premix     2 g 200 mL/hr over 30 Minutes Intravenous 30 min pre-op 01/23/17 1049 01/23/17 1556    .  He was given sequential compression devices, early ambulation, and TED for DVT prophylaxis.  He benefited maximally from the hospital stay and there were no complications.    Recent vital signs:  Vitals:   01/24/17 0400 01/24/17 0807  BP: 128/73 131/73  Pulse: 77 73  Resp: 20 20  Temp: 98.5 F (36.9 C)   SpO2: 99% 99%    Recent laboratory studies:  Lab Results  Component Value Date   HGB 12.4 (L) 01/18/2017   HGB 13.5 12/23/2012   Lab Results  Component Value Date   WBC 7.5  01/18/2017   PLT 346 01/18/2017   No results found for: INR Lab Results  Component Value Date   NA 137 01/18/2017   K 4.3 01/18/2017   CL 103 01/18/2017   CO2 26 01/18/2017   BUN 19 01/18/2017   CREATININE 0.85 01/18/2017   GLUCOSE 107 (H) 01/18/2017    Discharge Medications:   Allergies as of 01/24/2017      Reactions   Latex    Rash   Tape    Rash      Medication List    STOP taking these medications   aspirin EC 81 MG tablet   DUEXIS 800-26.6 MG Tabs Generic drug:  Ibuprofen-Famotidine   HYDROcodone-acetaminophen 10-325 MG tablet Commonly known as:  NORCO     TAKE these medications   cholecalciferol 400 units Tabs tablet Commonly known as:  VITAMIN D Take 400 Units by mouth.   CITRACAL + D PO Take 1 tablet by mouth daily.   eszopiclone 3 MG Tabs Generic drug:  Eszopiclone Take 3 mg by mouth at bedtime. Take immediately before bedtime   FLUoxetine 40 MG capsule Commonly known as:  PROZAC Take 40 mg by mouth daily.   LINZESS 290 MCG Caps capsule Generic drug:  linaclotide TAKE 1 CAPSULE (290 MCG TOTAL) BY MOUTH DAILY.   lisinopril-hydrochlorothiazide 20-12.5 MG tablet Commonly known as:  PRINZIDE,ZESTORETIC Take 1 tablet by mouth daily.   magnesium oxide 400 MG tablet Commonly known as:  MAG-OX Take 400 mg by mouth daily.   methocarbamol 750 MG tablet Commonly known as:  ROBAXIN Take 750 mg by mouth 3 (three) times daily.   multivitamin tablet Take 1 tablet by mouth daily.   ondansetron 4 MG disintegrating tablet Commonly known as:  ZOFRAN ODT Take 1 tablet (4 mg total) by mouth every 8 (eight) hours as needed for nausea or vomiting.   oxyCODONE-acetaminophen 10-325 MG tablet Commonly known as:  PERCOCET Take 1 tablet by mouth every 4 (four) hours as needed for up to 7 days for pain.   pyridOXINE 100 MG tablet Commonly known as:  VITAMIN B-6 Take 100 mg by mouth daily.   vitamin B-12 1000 MCG tablet Commonly known as:   CYANOCOBALAMIN Take 1,000 mcg by mouth daily.   vitamin C 500 MG tablet Commonly known as:  ASCORBIC ACID Take 500 mg by mouth daily.       Diagnostic Studies: Dg Cervical Spine 2-3 Views  Result Date: 01/23/2017 CLINICAL DATA:  ANTERIOR CERVICAL DISCECTOMY AND FUSION CERVICAL FIVE THROUGH SEVEN. EXAM: DG C-ARM GT 120 MIN; CERVICAL SPINE - 2-3 VIEW COMPARISON:  None. FINDINGS: AP and lateral intraoperative fluoroscopic spot images are provided of the cervical spine. Anterior cervical fusion hardware is present at the C5 through C7 levels, with intervening disc spacers/cages. Hardware appears appropriately positioned. Fluoroscopy was provided for 47 seconds. IMPRESSION: Plate and screw fixation hardware at the C5 through C7 levels, with intervening disc spacers, appear intact and appropriately positioned. No evidence of surgical complicating feature. Electronically Signed   By: Bary RichardStan  Maynard M.D.   On: 01/23/2017 20:37   Dg C-arm Gt 120 Min  Result Date: 01/23/2017 CLINICAL DATA:  ANTERIOR CERVICAL DISCECTOMY AND FUSION CERVICAL FIVE THROUGH SEVEN. EXAM: DG C-ARM GT 120 MIN; CERVICAL SPINE - 2-3 VIEW COMPARISON:  None. FINDINGS: AP and lateral intraoperative fluoroscopic spot images are provided of the cervical spine. Anterior cervical fusion hardware is present at the C5 through C7 levels, with intervening disc spacers/cages. Hardware appears appropriately positioned. Fluoroscopy was provided for 47 seconds. IMPRESSION: Plate and screw fixation hardware at the C5 through C7 levels, with intervening disc spacers, appear intact and appropriately positioned. No evidence of surgical complicating feature. Electronically Signed   By: Bary RichardStan  Maynard M.D.   On: 01/23/2017 20:37    Disposition: 01-Home or Self Care Pt will present to clinic in 2 weeks Post op meds provided  Discharge Instructions    Incentive spirometry RT   Complete by:  As directed          Signed: Kirt BoysMayo, Carmen  Christina 01/24/2017, 10:59 AM

## 2017-01-24 NOTE — Progress Notes (Signed)
Pt doing well. Pt and wife given D/C instructions with Rx's, verbal understanding was provided. Pt's incision is clean and dry with no sign of infection. Pt's IV was removed prior to D/C. Pt D/C'd home via walking @ 0945 per MD order. Pt is stable @ D/C and has no other needs at this time. Rema FendtAshley Haruko Mersch, RN

## 2017-01-30 ENCOUNTER — Encounter (HOSPITAL_COMMUNITY): Payer: Self-pay | Admitting: Orthopedic Surgery

## 2018-09-01 MED ORDER — LISINOPRIL-HYDROCHLOROTHIAZIDE 20 MG-12.5 MG TAB
ORAL_TABLET | ORAL | 1 refills | Status: DC
Start: 2018-09-01 — End: 2018-10-08

## 2018-09-01 NOTE — Telephone Encounter (Signed)
Can you contact pt for 73mo f/u HTN

## 2018-09-02 NOTE — Telephone Encounter (Signed)
lmtcb and schedule f/u HTN

## 2018-09-09 NOTE — Telephone Encounter (Signed)
LMTCB

## 2018-09-11 NOTE — Telephone Encounter (Signed)
FYI

## 2018-09-11 NOTE — Telephone Encounter (Signed)
LMTCB

## 2018-10-08 ENCOUNTER — Ambulatory Visit: Admit: 2018-10-08 | Discharge: 2018-10-08 | Payer: BLUE CROSS/BLUE SHIELD | Attending: Family | Primary: Family

## 2018-10-08 ENCOUNTER — Ambulatory Visit: Attending: Family | Primary: Family

## 2018-10-08 DIAGNOSIS — I10 Essential (primary) hypertension: Secondary | ICD-10-CM

## 2018-10-08 MED ORDER — LISINOPRIL 20 MG TAB
20 mg | ORAL_TABLET | Freq: Every day | ORAL | 1 refills | Status: DC
Start: 2018-10-08 — End: 2019-03-31

## 2018-10-08 MED ORDER — HYDROXYZINE 25 MG TAB
25 mg | ORAL_TABLET | Freq: Three times a day (TID) | ORAL | 1 refills | Status: AC | PRN
Start: 2018-10-08 — End: 2018-10-18

## 2018-10-08 MED ORDER — DESONIDE 0.05 % TOPICAL CREAM
0.05 % | Freq: Two times a day (BID) | CUTANEOUS | 99 refills | Status: DC
Start: 2018-10-08 — End: 2019-04-21

## 2018-10-08 MED ORDER — LINZESS 290 MCG CAPSULE
290 mcg | ORAL_CAPSULE | Freq: Every day | ORAL | 3 refills | Status: DC
Start: 2018-10-08 — End: 2019-04-21

## 2018-10-08 NOTE — Progress Notes (Signed)
Travis Weaver labs are normal please call the patient

## 2018-10-08 NOTE — Progress Notes (Signed)
HISTORY OF PRESENT ILLNESS  Travis Weaver is a 53 y.o. male presents to the office for medication refill and lab work    . Hypertension: Patients hypertension is well controlled on regimen of lisinopril/hctz. Denies headaches, blurred vision or dizziness.    IBS-C refill      Low/mid bilateral back pain with sitting/leaning    Chronic dermatitis/eczema on eyebrows/scalp/edges of nose/arm pits and upper back uses desonide cream    Sees Dr. Chancy Milroy for pain management for a few aches and pains/has intervened and "burnt" nerves in back prior          Vitals:    10/08/18 0808   BP: 128/80   Pulse: 72   Resp: 16   Temp: 98.5 ??F (36.9 ??C)   TempSrc: Oral   SpO2: 99%   Weight: 163 lb (73.9 kg)   Height: 5\' 7"  (1.702 m)     Patient Active Problem List   Diagnosis Code   ??? Eczema L30.9   ??? Essential hypertension I10   ??? 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 IBS (irritable bowel syndrome) K58.9   ??? Constipation K59.00     Patient Active Problem List    Diagnosis Date Noted   ??? Eczema 10/08/2018   ??? Essential hypertension 10/08/2018   ??? Ventricular premature complex 10/08/2018   ??? IBS (irritable bowel syndrome) 10/08/2018   ??? Constipation 10/08/2018     Current Outpatient Medications   Medication Sig Dispense Refill   ??? Linzess 290 mcg cap capsule      ??? desonide (TRIDESILON) 0.05 % cream Apply  to affected area two (2) times a day.     ??? lisinopril-hydroCHLOROthiazide (PRINZIDE, ZESTORETIC) 20-12.5 mg per tablet TAKE 1 TABLET BY MOUTH EVERY DAY 90 Tab 1     No Known Allergies  Past Medical History:   Diagnosis Date   ??? Contact dermatitis and eczema due to cause    ??? Hypertension      History reviewed. No pertinent surgical history.  Family History   Problem Relation Age of Onset   ??? Arthritis-osteo Mother    ??? Cancer Mother    ??? Elevated Lipids Mother    ??? Heart Disease Mother    ??? Hypertension Mother    ??? Arthritis-osteo Father    ??? Cancer Father    ??? Elevated Lipids Father    ??? Hypertension Father    ??? Hypertension Sister     ??? Arthritis-osteo Brother      Social History     Tobacco Use   ??? Smoking status: Former Smoker   ??? Smokeless tobacco: Current User   ??? Tobacco comment: Patient vapes   Substance Use Topics   ??? Alcohol use: Never     Frequency: Never           Review of Systems   Constitutional: Negative for fever and weight loss.   HENT: Negative for sore throat and tinnitus.    Eyes: Negative for blurred vision and double vision.   Respiratory: Negative for shortness of breath.    Cardiovascular: Negative for chest pain, palpitations and leg swelling.   Gastrointestinal: Positive for constipation. Negative for diarrhea.   Musculoskeletal: Positive for back pain.   Skin: Positive for itching and rash.   Neurological: Negative for dizziness.   Endo/Heme/Allergies: Does not bruise/bleed easily.   Psychiatric/Behavioral: Negative for depression. The patient is not nervous/anxious and does not have insomnia.        Physical Exam  Constitutional:       Appearance: Normal appearance. He is normal weight.   HENT:      Head: Normocephalic and atraumatic.      Mouth/Throat:      Mouth: Mucous membranes are moist.   Eyes:      Extraocular Movements: Extraocular movements intact.   Neck:      Musculoskeletal: Normal range of motion.   Cardiovascular:      Rate and Rhythm: Normal rate and regular rhythm.      Pulses: Normal pulses.      Heart sounds: Normal heart sounds.   Pulmonary:      Effort: Pulmonary effort is normal.      Breath sounds: Normal breath sounds.   Chest:      Comments: Barrel chest  Musculoskeletal: Normal range of motion.      Comments: Lordosis evident   Skin:     General: Skin is warm and dry.      Findings: Rash present.   Neurological:      General: No focal deficit present.      Mental Status: He is alert and oriented to person, place, and time.   Psychiatric:         Mood and Affect: Mood normal.           ASSESSMENT and PLAN    1. Essential hypertension  Taking off od HCTZ piece.. (study on skin cancer ) but BP  has consistently good will make sure it doesn't start coming up will add on Chlorthalidone if needed  - lisinopriL (PRINIVIL, ZESTRIL) 20 mg tablet; Take 1 Tab by mouth daily.  Dispense: 90 Tab; Refill: 1  - CBC WITH AUTOMATED DIFF  - METABOLIC PANEL, COMPREHENSIVE    2. Irritable bowel syndrome with constipation  Well maintained with meds  - Linzess 290 mcg cap capsule; Take 1 Cap by mouth daily.  Dispense: 90 Cap; Refill: 3    3. 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 suggested to try a few OTC treatments like T-gel shampoo and OTC jock itch (ketoconozole) applications Also gave a few hydroxizine when his itching is out of control.   - desonide (TRIDESILON) 0.05 % cream; Apply  to affected area two (2) times a day.  Dispense: 60 g; Refill: 99  - hydrOXYzine HCL (ATARAX) 25 mg tablet; Take 1 Tab by mouth three (3) times daily as needed for Itching for up to 10 days.  Dispense: 60 Tab; Refill: 1        Jonnie Finnerlaiborne T Perdue, NP

## 2018-10-08 NOTE — Patient Instructions (Signed)
High Blood Pressure: Care Instructions  Overview     It's normal for blood pressure to go up and down throughout the day. But if it stays up, you have high blood pressure. Another name for high blood pressure is hypertension.  Despite what a lot of people think, high blood pressure usually doesn't cause headaches or make you feel dizzy or lightheaded. It usually has no symptoms. But it does increase your risk of stroke, heart attack, and other problems. You and your doctor will talk about your risks of these problems based on your blood pressure.  Your doctor will give you a goal for your blood pressure. Your goal will be based on your health and your age.  Lifestyle changes, such as eating healthy and being active, are always important to help lower blood pressure. You might also take medicine to reach your blood pressure goal.  Follow-up care is a key part of your treatment and safety. Be sure to make and go to all appointments, and call your doctor if you are having problems. It's also a good idea to know your test results and keep a list of the medicines you take.  How can you care for yourself at home?  Medical treatment  ?? If you stop taking your medicine, your blood pressure will go back up. You may take one or more types of medicine to lower your blood pressure. Be safe with medicines. Take your medicine exactly as prescribed. Call your doctor if you think you are having a problem with your medicine.  ?? Talk to your doctor before you start taking aspirin every day. Aspirin can help certain people lower their risk of a heart attack or stroke. But taking aspirin isn't right for everyone, because it can cause serious bleeding.  ?? See your doctor regularly. You may need to see the doctor more often at first or until your blood pressure comes down.  ?? If you are taking blood pressure medicine, talk to your doctor before you take decongestants or anti-inflammatory medicine, such as ibuprofen.  Some of these medicines can raise blood pressure.  ?? Learn how to check your blood pressure at home.  Lifestyle changes  ?? Stay at a healthy weight. This is especially important if you put on weight around the waist. Losing even 10 pounds can help you lower your blood pressure.  ?? If your doctor recommends it, get more exercise. Walking is a good choice. Bit by bit, increase the amount you walk every day. Try for at least 30 minutes on most days of the week. You also may want to swim, bike, or do other activities.  ?? Avoid or limit alcohol. Talk to your doctor about whether you can drink any alcohol.  ?? Try to limit how much sodium you eat to less than 2,300 milligrams (mg) a day. Your doctor may ask you to try to eat less than 1,500 mg a day.  ?? Eat plenty of fruits (such as bananas and oranges), vegetables, legumes, whole grains, and low-fat dairy products.  ?? Lower the amount of saturated fat in your diet. Saturated fat is found in animal products such as milk, cheese, and meat. Limiting these foods may help you lose weight and also lower your risk for heart disease.  ?? Do not smoke. Smoking increases your risk for heart attack and stroke. If you need help quitting, talk to your doctor about stop-smoking programs and medicines. These can increase your chances of quitting for good.    When should you call for help?   Call  911 anytime you think you may need emergency care. This may mean having symptoms that suggest that your blood pressure is causing a serious heart or blood vessel problem. Your blood pressure may be over 180/120.  For example, call 911 if:  ?? ?? You have symptoms of a heart attack. These may include:  ? Chest pain or pressure, or a strange feeling in the chest.  ? Sweating.  ? Shortness of breath.  ? Nausea or vomiting.  ? Pain, pressure, or a strange feeling in the back, neck, jaw, or upper belly or in one or both shoulders or arms.  ? Lightheadedness or sudden weakness.   ? A fast or irregular heartbeat.   ?? ?? You have symptoms of a stroke. These may include:  ? Sudden numbness, tingling, weakness, or loss of movement in your face, arm, or leg, especially on only one side of your body.  ? Sudden vision changes.  ? Sudden trouble speaking.  ? Sudden confusion or trouble understanding simple statements.  ? Sudden problems with walking or balance.  ? A sudden, severe headache that is different from past headaches.   ?? ?? You have severe back or belly pain.   Do not wait until your blood pressure comes down on its own. Get help right away.  Call your doctor now or seek immediate care if:  ?? ?? Your blood pressure is much higher than normal (such as 180/120 or higher), but you don't have symptoms.   ?? ?? You think high blood pressure is causing symptoms, such as:  ? Severe headache.  ? Blurry vision.   Watch closely for changes in your health, and be sure to contact your doctor if:  ?? ?? Your blood pressure measures higher than your doctor recommends at least 2 times. That means the top number is higher or the bottom number is higher, or both.   ?? ?? You think you may be having side effects from your blood pressure medicine.   Where can you learn more?  Go to https://www.healthwise.net/[facility]  Enter X567 in the search box to learn more about "High Blood Pressure: Care Instructions."  Current as of: January 13, 2018??????????????????????????????Content Version: 12.6  ?? 2006-2020 [employer].   Care instructions adapted under license by [employer] (which disclaims liability or warranty for this information). If you have questions about a medical condition or this instruction, always ask your healthcare professional. [employer]wise[employer] disclaims any warranty or liability for your use of this information.

## 2018-10-08 NOTE — Progress Notes (Signed)
HISTORY OF PRESENT ILLNESS  Travis Weaver is a 53 y.o. male presents to the office for medication refill and lab work    . Hypertension: Patients hypertension is well controlled on regimen of lisinopril/hctz. Denies headaches, blurred vision or dizziness.    IBS-C refill      Low/mid bilateral back pain with sitting/leaning    Chronic dermatitis/eczema on eyebrows/scalp/edges of nose/arm pits and upper back uses desonide cream    Sees Dr. Chancy Milroy for pain management for a few aches and pains/has intervened and "burnt" nerves in back prior          Vitals:    10/08/18 0808   BP: 128/80   Pulse: 72   Resp: 16   Temp: 98.5 ??F (36.9 ??C)   TempSrc: Oral   SpO2: 99%   Weight: 163 lb (73.9 kg)   Height: 5\' 7"  (1.702 m)     Patient Active Problem List   Diagnosis Code   ??? Eczema L30.9   ??? Essential hypertension I10   ??? 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 IBS (irritable bowel syndrome) K58.9   ??? Constipation K59.00     Patient Active Problem List    Diagnosis Date Noted   ??? Eczema 10/08/2018   ??? Essential hypertension 10/08/2018   ??? Ventricular premature complex 10/08/2018   ??? IBS (irritable bowel syndrome) 10/08/2018   ??? Constipation 10/08/2018     Current Outpatient Medications   Medication Sig Dispense Refill   ??? Linzess 290 mcg cap capsule      ??? desonide (TRIDESILON) 0.05 % cream Apply  to affected area two (2) times a day.     ??? lisinopril-hydroCHLOROthiazide (PRINZIDE, ZESTORETIC) 20-12.5 mg per tablet TAKE 1 TABLET BY MOUTH EVERY DAY 90 Tab 1     No Known Allergies  Past Medical History:   Diagnosis Date   ??? Contact dermatitis and eczema due to cause    ??? Hypertension      History reviewed. No pertinent surgical history.  Family History   Problem Relation Age of Onset   ??? Arthritis-osteo Mother    ??? Cancer Mother    ??? Elevated Lipids Mother    ??? Heart Disease Mother    ??? Hypertension Mother    ??? Arthritis-osteo Father    ??? Cancer Father    ??? Elevated Lipids Father    ??? Hypertension Father    ??? Hypertension Sister     ??? Arthritis-osteo Brother      Social History     Tobacco Use   ??? Smoking status: Former Smoker   ??? Smokeless tobacco: Current User   ??? Tobacco comment: Patient vapes   Substance Use Topics   ??? Alcohol use: Never     Frequency: Never           Review of Systems   Constitutional: Negative for fever and weight loss.   HENT: Negative for sore throat and tinnitus.    Eyes: Negative for blurred vision and double vision.   Respiratory: Negative for shortness of breath.    Cardiovascular: Negative for chest pain, palpitations and leg swelling.   Gastrointestinal: Positive for constipation. Negative for diarrhea.   Musculoskeletal: Positive for back pain.   Skin: Positive for itching and rash.   Neurological: Negative for dizziness.   Endo/Heme/Allergies: Does not bruise/bleed easily.   Psychiatric/Behavioral: Negative for depression. The patient is not nervous/anxious and does not have insomnia.        Physical Exam  Constitutional:       Appearance: Normal appearance. He is normal weight.   HENT:      Head: Normocephalic and atraumatic.      Mouth/Throat:      Mouth: Mucous membranes are moist.   Eyes:      Extraocular Movements: Extraocular movements intact.   Neck:      Musculoskeletal: Normal range of motion.   Cardiovascular:      Rate and Rhythm: Normal rate and regular rhythm.      Pulses: Normal pulses.      Heart sounds: Normal heart sounds.   Pulmonary:      Effort: Pulmonary effort is normal.      Breath sounds: Normal breath sounds.   Chest:      Comments: Barrel chest  Musculoskeletal: Normal range of motion.      Comments: Lordosis evident   Skin:     General: Skin is warm and dry.      Findings: Rash present.   Neurological:      General: No focal deficit present.      Mental Status: He is alert and oriented to person, place, and time.   Psychiatric:         Mood and Affect: Mood normal.           ASSESSMENT and PLAN    1. Essential hypertension   Taking off od HCTZ piece.. (study on skin cancer ) but BP has consistently good will make sure it doesn't start coming up will add on Chlorthalidone if needed  - lisinopriL (PRINIVIL, ZESTRIL) 20 mg tablet; Take 1 Tab by mouth daily.  Dispense: 90 Tab; Refill: 1  - CBC WITH AUTOMATED DIFF  - METABOLIC PANEL, COMPREHENSIVE    2. Irritable bowel syndrome with constipation  Well maintained with meds  - Linzess 290 mcg cap capsule; Take 1 Cap by mouth daily.  Dispense: 90 Cap; Refill: 3    3. 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 suggested to try a few OTC treatments like T-gel shampoo and OTC jock itch (ketoconozole) applications Also gave a few hydroxizine when his itching is out of control.   - desonide (TRIDESILON) 0.05 % cream; Apply  to affected area two (2) times a day.  Dispense: 60 g; Refill: 99  - hydrOXYzine HCL (ATARAX) 25 mg tablet; Take 1 Tab by mouth three (3) times daily as needed for Itching for up to 10 days.  Dispense: 60 Tab; Refill: [place], NP

## 2018-10-08 NOTE — Progress Notes (Signed)
Travis Weaver labs are normal please call the patient

## 2018-10-09 LAB — COMPREHENSIVE METABOLIC PANEL
ALT: 21 IU/L (ref 0–44)
AST: 14 IU/L (ref 0–40)
Albumin/Globulin Ratio: 2.5 NA — ABNORMAL HIGH (ref 1.2–2.2)
Albumin: 4.9 g/dL (ref 3.8–4.9)
Alkaline Phosphatase: 52 IU/L (ref 39–117)
BUN: 20 mg/dL (ref 6–24)
Bun/Cre Ratio: 22 NA — ABNORMAL HIGH (ref 9–20)
CO2: 25 mmol/L (ref 20–29)
Calcium: 9.7 mg/dL (ref 8.7–10.2)
Chloride: 103 mmol/L (ref 96–106)
Creatinine: 0.89 mg/dL (ref 0.76–1.27)
EGFR IF NonAfrican American: 96 mL/min/{1.73_m2} (ref >59)
GFR African American: 111 mL/min/{1.73_m2} (ref >59)
Globulin, Total: 2 g/dL (ref 1.5–4.5)
Glucose: 91 mg/dL (ref 65–99)
Potassium: 4.5 mmol/L (ref 3.5–5.2)
Sodium: 142 mmol/L (ref 134–144)
Total Bilirubin: <0.2 mg/dL (ref 0.0–1.2)
Total Protein: 6.9 g/dL (ref 6.0–8.5)

## 2018-10-09 LAB — CBC WITH AUTO DIFFERENTIAL
Basophils %: 1 %
Basophils Absolute: 0.1 10*3/uL (ref 0.0–0.2)
Eosinophils %: 2 %
Eosinophils Absolute: 0.2 10*3/uL (ref 0.0–0.4)
Granulocyte Absolute Count: 0 10*3/uL (ref 0.0–0.1)
Hematocrit: 40.4 % (ref 37.5–51.0)
Hemoglobin: 13.4 g/dL (ref 13.0–17.7)
Immature Granulocytes: 0 %
Lymphocytes %: 39 %
Lymphocytes Absolute: 3 10*3/uL (ref 0.7–3.1)
MCH: 31.6 pg (ref 26.6–33.0)
MCHC: 33.2 g/dL (ref 31.5–35.7)
MCV: 95 fL (ref 79–97)
Monocytes %: 7 %
Monocytes Absolute: 0.6 10*3/uL (ref 0.1–0.9)
Neutrophils %: 51 %
Neutrophils Absolute: 3.9 10*3/uL (ref 1.4–7.0)
Platelets: 364 10*3/uL (ref 150–450)
RBC: 4.24 x10E6/uL (ref 4.14–5.80)
RDW: 12.9 % (ref 11.6–15.4)
WBC: 7.8 10*3/uL (ref 3.4–10.8)

## 2018-10-09 LAB — METABOLIC PANEL, COMPREHENSIVE
A-G Ratio: 2.5 — ABNORMAL HIGH (ref 1.2–2.2)
ALT (SGPT): 21 IU/L (ref 0–44)
AST (SGOT): 14 IU/L (ref 0–40)
Albumin: 4.9 g/dL (ref 3.8–4.9)
Alk. phosphatase: 52 IU/L (ref 39–117)
BUN/Creatinine ratio: 22 — ABNORMAL HIGH (ref 9–20)
BUN: 20 mg/dL (ref 6–24)
Bilirubin, total: <0.2 mg/dL (ref 0.0–1.2)
CO2: 25 mmol/L (ref 20–29)
Calcium: 9.7 mg/dL (ref 8.7–10.2)
Chloride: 103 mmol/L (ref 96–106)
Creatinine: 0.89 mg/dL (ref 0.76–1.27)
GFR est AA: 111 mL/min/{1.73_m2} (ref >59)
GFR est non-AA: 96 mL/min/{1.73_m2} (ref >59)
GLOBULIN, TOTAL: 2 g/dL (ref 1.5–4.5)
Glucose: 91 mg/dL (ref 65–99)
Potassium: 4.5 mmol/L (ref 3.5–5.2)
Protein, total: 6.9 g/dL (ref 6.0–8.5)
Sodium: 142 mmol/L (ref 134–144)

## 2018-10-09 LAB — CBC WITH AUTOMATED DIFF
ABS. BASOPHILS: 0.1 10*3/uL (ref 0.0–0.2)
ABS. EOSINOPHILS: 0.2 10*3/uL (ref 0.0–0.4)
ABS. IMM. GRANS.: 0 10*3/uL (ref 0.0–0.1)
ABS. MONOCYTES: 0.6 10*3/uL (ref 0.1–0.9)
ABS. NEUTROPHILS: 3.9 10*3/uL (ref 1.4–7.0)
Abs Lymphocytes: 3 10*3/uL (ref 0.7–3.1)
BASOPHILS: 1 %
EOSINOPHILS: 2 %
HCT: 40.4 % (ref 37.5–51.0)
HGB: 13.4 g/dL (ref 13.0–17.7)
IMMATURE GRANULOCYTES: 0 %
Lymphocytes: 39 %
MCH: 31.6 pg (ref 26.6–33.0)
MCHC: 33.2 g/dL (ref 31.5–35.7)
MCV: 95 fL (ref 79–97)
MONOCYTES: 7 %
NEUTROPHILS: 51 %
PLATELET: 364 10*3/uL (ref 150–450)
RBC: 4.24 x10E6/uL (ref 4.14–5.80)
RDW: 12.9 % (ref 11.6–15.4)
WBC: 7.8 10*3/uL (ref 3.4–10.8)

## 2018-10-15 NOTE — Telephone Encounter (Signed)
I called pt and LVM for him about his labs.LE

## 2019-03-18 IMAGING — RF DG C-ARM GT 120 MIN
1 series · 3 of 3 positions shown · non-contrast
Comparison: None.

CLINICAL DATA: ANTERIOR CERVICAL DISCECTOMY AND FUSION CERVICAL
FIVE THROUGH SEVEN.

EXAM:
DG C-ARM GT 120 MIN; CERVICAL SPINE - 2-3 VIEW

[Series 1: run · 3 of 3 slices shown]
[im 1/3]
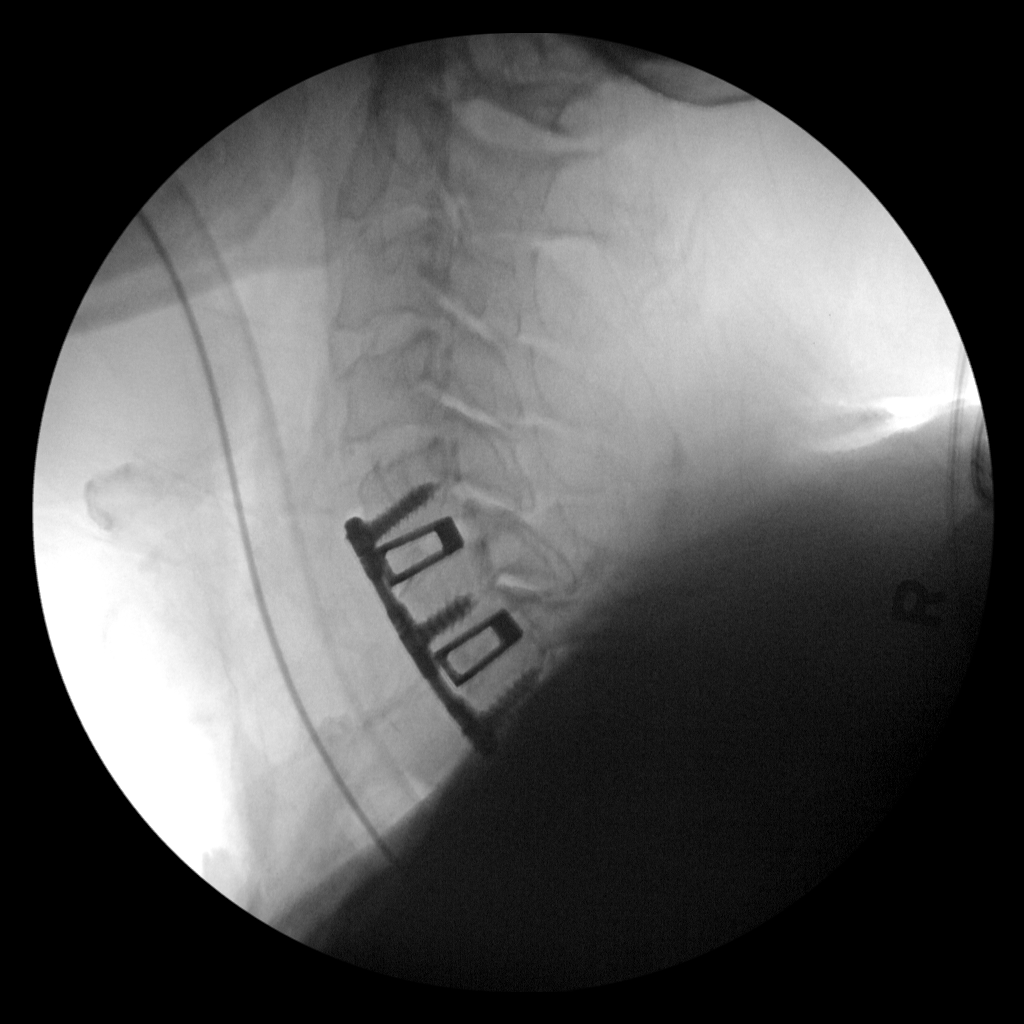
[im 2/3]
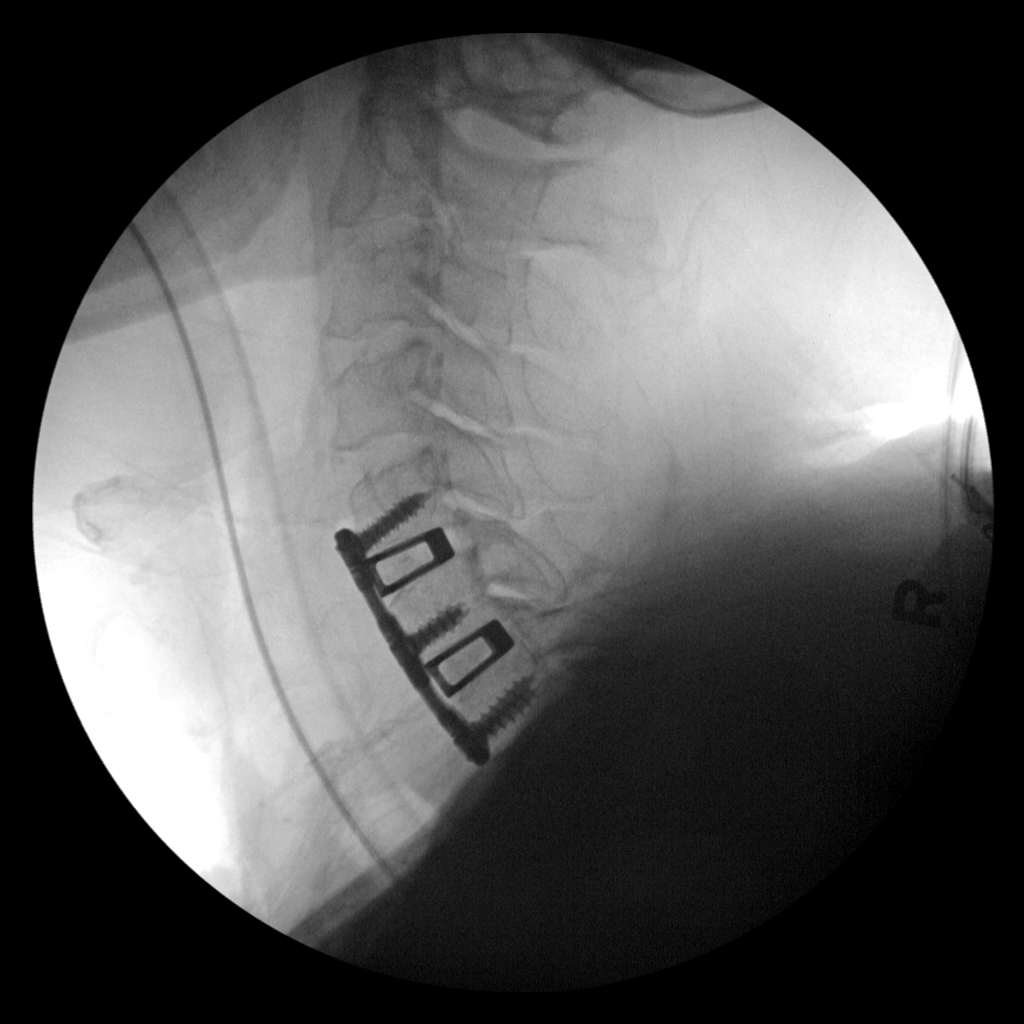
[im 3/3]
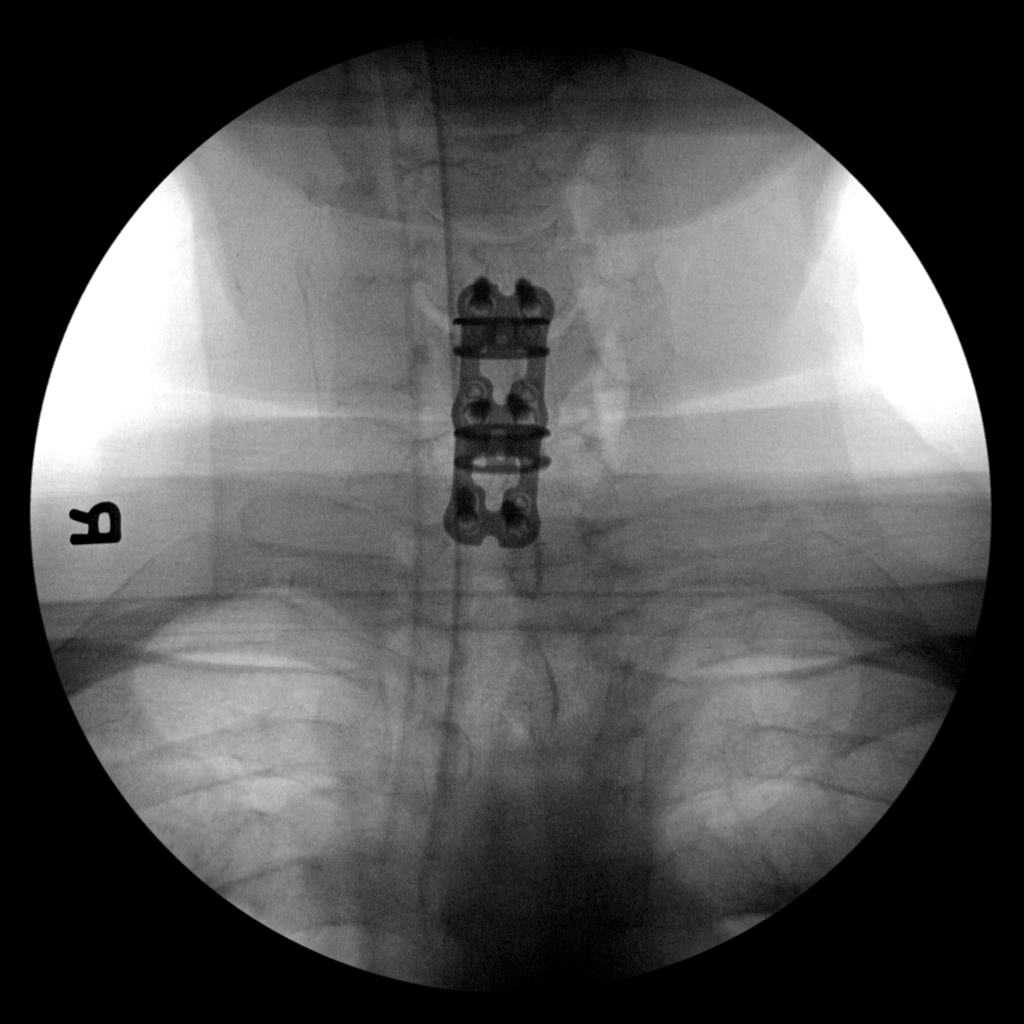

[3 of 3 positions shown; findings below may reference images not displayed]

FINDINGS: AP and lateral intraoperative fluoroscopic spot images are provided
of the cervical spine. Anterior cervical fusion hardware is present
at the C5 through C7 levels, with intervening disc spacers/cages.
Hardware appears appropriately positioned. Fluoroscopy was provided
for 47 seconds.
IMPRESSION: Plate and screw fixation hardware at the C5 through C7 levels, with
intervening disc spacers, appear intact and appropriately
positioned. No evidence of surgical complicating feature.

## 2019-03-31 ENCOUNTER — Encounter

## 2019-03-31 MED ORDER — LISINOPRIL 20 MG TAB
20 mg | ORAL_TABLET | ORAL | 1 refills | Status: DC
Start: 2019-03-31 — End: 2019-04-21

## 2019-04-12 ENCOUNTER — Encounter

## 2019-04-14 NOTE — Telephone Encounter (Signed)
Please call patient and needs first available appointment

## 2019-04-15 NOTE — Telephone Encounter (Signed)
Called and left message on vm for patient to call us back to setup appt for refill medications

## 2019-04-21 ENCOUNTER — Ambulatory Visit: Admit: 2019-04-21 | Discharge: 2019-04-21 | Payer: BLUE CROSS/BLUE SHIELD | Attending: Family | Primary: Family

## 2019-04-21 ENCOUNTER — Ambulatory Visit: Attending: Family | Primary: Family

## 2019-04-21 DIAGNOSIS — L309 Dermatitis, unspecified: Secondary | ICD-10-CM

## 2019-04-21 MED ORDER — LISINOPRIL 20 MG TAB
20 mg | ORAL_TABLET | Freq: Every day | ORAL | 1 refills | Status: DC
Start: 2019-04-21 — End: 2019-12-28

## 2019-04-21 MED ORDER — CLOBETASOL 0.05 % LOTION
0.05 % | Freq: Two times a day (BID) | CUTANEOUS | 2 refills | Status: AC
Start: 2019-04-21 — End: 2021-01-26

## 2019-04-21 MED ORDER — LINZESS 290 MCG CAPSULE
290 mcg | ORAL_CAPSULE | Freq: Every day | ORAL | 3 refills | Status: DC
Start: 2019-04-21 — End: 2019-10-29

## 2019-04-21 NOTE — Progress Notes (Signed)
Notified patient through portal regarding lab results.

## 2019-04-21 NOTE — Progress Notes (Signed)
HISTORY OF PRESENT ILLNESS  Travis Weaver is a 54 y.o. male presents to the office for medication refill and lab work    1. Hypertension: Patients hypertension is well controlled on regimen of . Denies headaches, blurred vision or dizziness.    2.Acid Reflux: Patients GERD is well controlled on current regimen of pepcid.    3. IBS: patient reported that his constipation is relieved by linzess 290.    4. Eczema:  Patient reported that his eczema to his scalp is not relived by current treatment of desonide ointment.  Has flaking skin to scalp and rough patches.     5. Back issues:  Patient has chronic back pain related to budlging disc and DDD.  Sees pain management Dr. Park Breed and NP Junius Argyle.   They have done multiple ablations to nerves in back as well as epidurals.  Continues with pain in lumbar spine intermittently.  Also concerned about pain in thoracic back on both sides that bothers him specifically while driving.    Had an MRI with pain management that showed incidental renal cysts, he was encouraged to follow up with PCP for further evaluation.      Vitals:    04/21/19 1110   BP: 130/73   Pulse: 69   Resp: 18   Temp: 97.3 ??F (36.3 ??C)   TempSrc: Temporal   SpO2: 99%   Weight: 174 lb (78.9 kg)   Height: 5\' 7"  (1.702 m)     Patient Active Problem List   Diagnosis Code   ??? Eczema L30.9   ??? Essential hypertension I10   ??? 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 IBS (irritable bowel syndrome) K58.9   ??? Constipation K59.00   ??? Degenerative disc disease, lumbar M51.36     Patient Active Problem List    Diagnosis Date Noted   ??? Degenerative disc disease, lumbar 04/21/2019   ??? Eczema 10/08/2018   ??? Essential hypertension 10/08/2018   ??? Ventricular premature complex 10/08/2018   ??? IBS (irritable bowel syndrome) 10/08/2018   ??? Constipation 10/08/2018     Current Outpatient Medications   Medication Sig Dispense Refill   ??? HYDROcodone-acetaminophen (NORCO) 10-325 mg tablet      ??? famotidine (PEPCID) 20 mg tablet Take 20 mg  by mouth daily.     ??? clobetasoL (CLOBEX) 0.05 % lotion Apply  to affected area two (2) times a day. 59 mL 2   ??? Linzess 290 mcg cap capsule Take 1 Cap by mouth daily. 90 Cap 3   ??? lisinopriL (PRINIVIL, ZESTRIL) 20 mg tablet Take 1 Tab by mouth daily. 90 Tab 1     No Known Allergies  Past Medical History:   Diagnosis Date   ??? Contact dermatitis and eczema due to cause    ??? Hypertension      History reviewed. No pertinent surgical history.  Family History   Problem Relation Age of Onset   ??? Arthritis-osteo Mother    ??? Cancer Mother    ??? Elevated Lipids Mother    ??? Heart Disease Mother    ??? Hypertension Mother    ??? Arthritis-osteo Father    ??? Cancer Father    ??? Elevated Lipids Father    ??? Hypertension Father    ??? Hypertension Sister    ??? Arthritis-osteo Brother      Social History     Tobacco Use   ??? Smoking status: Former Smoker   ??? Smokeless tobacco: Never Used   ???  Tobacco comment: Patient vapes   Substance Use Topics   ??? Alcohol use: Never     Frequency: Never           Review of Systems   Constitutional: Negative for malaise/fatigue and weight loss.   Eyes: Negative for blurred vision, double vision and pain.   Respiratory: Negative for cough and shortness of breath.    Cardiovascular: Negative for chest pain, palpitations, orthopnea and leg swelling.   Gastrointestinal: Positive for constipation. Negative for abdominal pain, diarrhea, heartburn, nausea and vomiting.   Genitourinary: Negative for dysuria, frequency, hematuria and urgency.   Musculoskeletal: Positive for back pain. Negative for joint pain and myalgias.   Skin: Negative for itching and rash.   Neurological: Negative for dizziness, tingling, loss of consciousness, weakness and headaches.   Endo/Heme/Allergies: Does not bruise/bleed easily.   Psychiatric/Behavioral: Negative for depression. The patient is not nervous/anxious.        Physical Exam  Constitutional:       Appearance: Normal appearance.      Comments: Appears uncomfortable will sitting,  standing during most of exam.    HENT:      Head: Normocephalic and atraumatic.   Eyes:      Extraocular Movements: Extraocular movements intact.      Conjunctiva/sclera: Conjunctivae normal.      Pupils: Pupils are equal, round, and reactive to light.   Cardiovascular:      Rate and Rhythm: Normal rate and regular rhythm.      Pulses: Normal pulses.   Pulmonary:      Effort: Pulmonary effort is normal.      Breath sounds: Normal breath sounds.   Musculoskeletal: Normal range of motion.   Skin:     General: Skin is warm and dry.      Findings: Rash (dry flaking redness to scalp.) present.   Neurological:      General: No focal deficit present.      Mental Status: He is alert and oriented to person, place, and time.   Psychiatric:         Mood and Affect: Mood normal.         Behavior: Behavior normal.           ASSESSMENT and PLAN    Diagnoses and all orders for this visit:    1. Eczema, unspecified type  Comments:  try clobetasol.     If no improvement in 2 weeks, he would like to see dermatology.  Orders:  -     clobetasoL (CLOBEX) 0.05 % lotion; Apply  to affected area two (2) times a day.    2. Irritable bowel syndrome with constipation  Comments:  well controlled on linzess.   Orders:  -     CBC WITH AUTOMATED DIFF  -     METABOLIC PANEL, COMPREHENSIVE  -     Linzess 290 mcg cap capsule; Take 1 Cap by mouth daily.    3. Essential hypertension  Comments:  stable on medication.   Orders:  -     CBC WITH AUTOMATED DIFF  -     METABOLIC PANEL, COMPREHENSIVE  -     LIPID PANEL  -     lisinopriL (PRINIVIL, ZESTRIL) 20 mg tablet; Take 1 Tab by mouth daily.    4. Screening for prostate cancer  -     PSA W/ REFLX FREE PSA    5. Renal cyst  Comments:  sending for renal US to evaluate further.  Orders:  -     [place] RETROPERITONEUM COMP; Future    6. Degenerative disc disease, lumbar  Comments:  managed by Dr. Park Breed.   Has scheduled epidural coming up.          Charlesetta Shanks, NP

## 2019-04-21 NOTE — Progress Notes (Signed)
HISTORY OF PRESENT ILLNESS  Travis Weaver is a 54 y.o. male presents to the office for medication refill and lab work    1. Hypertension: Patients hypertension is well controlled on regimen of . Denies headaches, blurred vision or dizziness.    2.Acid Reflux: Patients GERD is well controlled on current regimen of pepcid.    3. IBS: patient reported that his constipation is relieved by linzess 290.    4. Eczema:  Patient reported that his eczema to his scalp is not relived by current treatment of desonide ointment.  Has flaking skin to scalp and rough patches.     5. Back issues:  Patient has chronic back pain related to budlging disc and DDD.  Sees pain management Dr. Park Breed and NP Junius Argyle.   They have done multiple ablations to nerves in back as well as epidurals.  Continues with pain in lumbar spine intermittently.  Also concerned about pain in thoracic back on both sides that bothers him specifically while driving.    Had an MRI with pain management that showed incidental renal cysts, he was encouraged to follow up with PCP for further evaluation.      Vitals:    04/21/19 1110   BP: 130/73   Pulse: 69   Resp: 18   Temp: 97.3 ??F (36.3 ??C)   TempSrc: Temporal   SpO2: 99%   Weight: 174 lb (78.9 kg)   Height: 5\' 7"  (1.702 m)     Patient Active Problem List   Diagnosis Code   ??? Eczema L30.9   ??? Essential hypertension I10   ??? 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 IBS (irritable bowel syndrome) K58.9   ??? Constipation K59.00   ??? Degenerative disc disease, lumbar M51.36     Patient Active Problem List    Diagnosis Date Noted   ??? Degenerative disc disease, lumbar 04/21/2019   ??? Eczema 10/08/2018   ??? Essential hypertension 10/08/2018   ??? Ventricular premature complex 10/08/2018   ??? IBS (irritable bowel syndrome) 10/08/2018   ??? Constipation 10/08/2018     Current Outpatient Medications   Medication Sig Dispense Refill   ??? HYDROcodone-acetaminophen (NORCO) 10-325 mg tablet      ??? famotidine (PEPCID) 20 mg tablet Take 20 mg  by mouth daily.     ??? clobetasoL (CLOBEX) 0.05 % lotion Apply  to affected area two (2) times a day. 59 mL 2   ??? Linzess 290 mcg cap capsule Take 1 Cap by mouth daily. 90 Cap 3   ??? lisinopriL (PRINIVIL, ZESTRIL) 20 mg tablet Take 1 Tab by mouth daily. 90 Tab 1     No Known Allergies  Past Medical History:   Diagnosis Date   ??? Contact dermatitis and eczema due to cause    ??? Hypertension      History reviewed. No pertinent surgical history.  Family History   Problem Relation Age of Onset   ??? Arthritis-osteo Mother    ??? Cancer Mother    ??? Elevated Lipids Mother    ??? Heart Disease Mother    ??? Hypertension Mother    ??? Arthritis-osteo Father    ??? Cancer Father    ??? Elevated Lipids Father    ??? Hypertension Father    ??? Hypertension Sister    ??? Arthritis-osteo Brother      Social History     Tobacco Use   ??? Smoking status: Former Smoker   ??? Smokeless tobacco: Never Used   ???  Tobacco comment: Patient vapes   Substance Use Topics   ??? Alcohol use: Never     Frequency: Never           Review of Systems   Constitutional: Negative for malaise/fatigue and weight loss.   Eyes: Negative for blurred vision, double vision and pain.   Respiratory: Negative for cough and shortness of breath.    Cardiovascular: Negative for chest pain, palpitations, orthopnea and leg swelling.   Gastrointestinal: Positive for constipation. Negative for abdominal pain, diarrhea, heartburn, nausea and vomiting.   Genitourinary: Negative for dysuria, frequency, hematuria and urgency.   Musculoskeletal: Positive for back pain. Negative for joint pain and myalgias.   Skin: Negative for itching and rash.   Neurological: Negative for dizziness, tingling, loss of consciousness, weakness and headaches.   Endo/Heme/Allergies: Does not bruise/bleed easily.   Psychiatric/Behavioral: Negative for depression. The patient is not nervous/anxious.        Physical Exam  Constitutional:       Appearance: Normal appearance.      Comments: Appears uncomfortable will sitting,  standing during most of exam.    HENT:      Head: Normocephalic and atraumatic.   Eyes:      Extraocular Movements: Extraocular movements intact.      Conjunctiva/sclera: Conjunctivae normal.      Pupils: Pupils are equal, round, and reactive to light.   Cardiovascular:      Rate and Rhythm: Normal rate and regular rhythm.      Pulses: Normal pulses.   Pulmonary:      Effort: Pulmonary effort is normal.      Breath sounds: Normal breath sounds.   Musculoskeletal: Normal range of motion.   Skin:     General: Skin is warm and dry.      Findings: Rash (dry flaking redness to scalp.) present.   Neurological:      General: No focal deficit present.      Mental Status: He is alert and oriented to person, place, and time.   Psychiatric:         Mood and Affect: Mood normal.         Behavior: Behavior normal.           ASSESSMENT and PLAN    Diagnoses and all orders for this visit:    1. Eczema, unspecified type  Comments:  try clobetasol.     If no improvement in 2 weeks, he would like to see dermatology.  Orders:  -     clobetasoL (CLOBEX) 0.05 % lotion; Apply  to affected area two (2) times a day.    2. Irritable bowel syndrome with constipation  Comments:  well controlled on linzess.   Orders:  -     CBC WITH AUTOMATED DIFF  -     METABOLIC PANEL, COMPREHENSIVE  -     Linzess 290 mcg cap capsule; Take 1 Cap by mouth daily.    3. Essential hypertension  Comments:  stable on medication.   Orders:  -     CBC WITH AUTOMATED DIFF  -     METABOLIC PANEL, COMPREHENSIVE  -     LIPID PANEL  -     lisinopriL (PRINIVIL, ZESTRIL) 20 mg tablet; Take 1 Tab by mouth daily.    4. Screening for prostate cancer  -     PSA W/ REFLX FREE PSA    5. Renal cyst  Comments:  sending for renal US to evaluate further.  Orders:  -     [place] RETROPERITONEUM COMP; Future    6. Degenerative disc disease, lumbar  Comments:  managed by Dr. Kahn.   Has scheduled epidural coming up.          Tashonna Descoteaux, NP

## 2019-04-22 LAB — CBC WITH AUTO DIFFERENTIAL
Basophils %: 1 %
Basophils Absolute: 0.1 10*3/uL (ref 0.0–0.2)
Eosinophils %: 2 %
Eosinophils Absolute: 0.1 10*3/uL (ref 0.0–0.4)
Granulocyte Absolute Count: 0 10*3/uL (ref 0.0–0.1)
Hematocrit: 39.2 % (ref 37.5–51.0)
Hemoglobin: 13.4 g/dL (ref 13.0–17.7)
Immature Granulocytes: 0 %
Lymphocytes %: 24 %
Lymphocytes Absolute: 1.5 10*3/uL (ref 0.7–3.1)
MCH: 31.2 pg (ref 26.6–33.0)
MCHC: 34.2 g/dL (ref 31.5–35.7)
MCV: 91 fL (ref 79–97)
Monocytes %: 8 %
Monocytes Absolute: 0.5 10*3/uL (ref 0.1–0.9)
Neutrophils %: 65 %
Neutrophils Absolute: 3.9 10*3/uL (ref 1.4–7.0)
Platelets: 366 10*3/uL (ref 150–450)
RBC: 4.29 x10E6/uL (ref 4.14–5.80)
RDW: 12.8 % (ref 11.6–15.4)
WBC: 6.1 10*3/uL (ref 3.4–10.8)

## 2019-04-22 LAB — COMPREHENSIVE METABOLIC PANEL
ALT: 25 IU/L (ref 0–44)
AST: 22 IU/L (ref 0–40)
Albumin/Globulin Ratio: 2.1 NA (ref 1.2–2.2)
Albumin: 4.8 g/dL (ref 3.8–4.9)
Alkaline Phosphatase: 36 IU/L — ABNORMAL LOW (ref 39–117)
BUN: 25 mg/dL — ABNORMAL HIGH (ref 6–24)
Bun/Cre Ratio: 27 NA — ABNORMAL HIGH (ref 9–20)
CO2: 24 mmol/L (ref 20–29)
Calcium: 9.7 mg/dL (ref 8.7–10.2)
Chloride: 101 mmol/L (ref 96–106)
Creatinine: 0.94 mg/dL (ref 0.76–1.27)
EGFR IF NonAfrican American: 91 mL/min/{1.73_m2} (ref >59)
GFR African American: 105 mL/min/{1.73_m2} (ref >59)
Globulin, Total: 2.3 g/dL (ref 1.5–4.5)
Glucose: 97 mg/dL (ref 65–99)
Potassium: 4.4 mmol/L (ref 3.5–5.2)
Sodium: 139 mmol/L (ref 134–144)
Total Bilirubin: 0.3 mg/dL (ref 0.0–1.2)
Total Protein: 7.1 g/dL (ref 6.0–8.5)

## 2019-04-22 LAB — LIPID PANEL
Cholesterol, Total: 225 mg/dL — ABNORMAL HIGH (ref 100–199)
Cholesterol, total: 225 mg/dL — ABNORMAL HIGH (ref 100–199)
HDL Cholesterol: 57 mg/dL (ref >39)
HDL: 57 mg/dL (ref >39)
LDL Calculated: 143 mg/dL — ABNORMAL HIGH (ref 0–99)
LDL, calculated: 143 mg/dL — ABNORMAL HIGH (ref 0–99)
Triglyceride: 139 mg/dL (ref 0–149)
Triglycerides: 139 mg/dL (ref 0–149)
VLDL, calculated: 25 mg/dL (ref 5–40)
VLDL: 25 mg/dL (ref 5–40)

## 2019-04-22 LAB — PSA W/ REFLX FREE PSA
PSA: 0.9 ng/mL (ref 0.0–4.0)
Prostate Specific Ag: 0.9 ng/mL (ref 0.0–4.0)

## 2019-04-22 LAB — CBC WITH AUTOMATED DIFF
ABS. BASOPHILS: 0.1 10*3/uL (ref 0.0–0.2)
ABS. EOSINOPHILS: 0.1 10*3/uL (ref 0.0–0.4)
ABS. IMM. GRANS.: 0 10*3/uL (ref 0.0–0.1)
ABS. MONOCYTES: 0.5 10*3/uL (ref 0.1–0.9)
ABS. NEUTROPHILS: 3.9 10*3/uL (ref 1.4–7.0)
Abs Lymphocytes: 1.5 10*3/uL (ref 0.7–3.1)
BASOPHILS: 1 %
EOSINOPHILS: 2 %
HCT: 39.2 % (ref 37.5–51.0)
HGB: 13.4 g/dL (ref 13.0–17.7)
IMMATURE GRANULOCYTES: 0 %
Lymphocytes: 24 %
MCH: 31.2 pg (ref 26.6–33.0)
MCHC: 34.2 g/dL (ref 31.5–35.7)
MCV: 91 fL (ref 79–97)
MONOCYTES: 8 %
NEUTROPHILS: 65 %
PLATELET: 366 10*3/uL (ref 150–450)
RBC: 4.29 x10E6/uL (ref 4.14–5.80)
RDW: 12.8 % (ref 11.6–15.4)
WBC: 6.1 10*3/uL (ref 3.4–10.8)

## 2019-04-22 LAB — METABOLIC PANEL, COMPREHENSIVE
A-G Ratio: 2.1 (ref 1.2–2.2)
ALT (SGPT): 25 IU/L (ref 0–44)
AST (SGOT): 22 IU/L (ref 0–40)
Albumin: 4.8 g/dL (ref 3.8–4.9)
Alk. phosphatase: 36 IU/L — ABNORMAL LOW (ref 39–117)
BUN/Creatinine ratio: 27 — ABNORMAL HIGH (ref 9–20)
BUN: 25 mg/dL — ABNORMAL HIGH (ref 6–24)
Bilirubin, total: 0.3 mg/dL (ref 0.0–1.2)
CO2: 24 mmol/L (ref 20–29)
Calcium: 9.7 mg/dL (ref 8.7–10.2)
Chloride: 101 mmol/L (ref 96–106)
Creatinine: 0.94 mg/dL (ref 0.76–1.27)
GFR est AA: 105 mL/min/{1.73_m2} (ref >59)
GFR est non-AA: 91 mL/min/{1.73_m2} (ref >59)
GLOBULIN, TOTAL: 2.3 g/dL (ref 1.5–4.5)
Glucose: 97 mg/dL (ref 65–99)
Potassium: 4.4 mmol/L (ref 3.5–5.2)
Protein, total: 7.1 g/dL (ref 6.0–8.5)
Sodium: 139 mmol/L (ref 134–144)

## 2019-05-04 ENCOUNTER — Encounter

## 2019-05-04 NOTE — Progress Notes (Signed)
Sent message via mychart.

## 2019-07-09 ENCOUNTER — Encounter

## 2019-07-10 MED ORDER — BENZONATATE 200 MG CAP
200 mg | ORAL_CAPSULE | Freq: Three times a day (TID) | ORAL | 0 refills | Status: DC | PRN
Start: 2019-07-10 — End: 2019-11-05

## 2019-07-10 NOTE — Telephone Encounter (Signed)
Telephone Encounter by Charlesetta Shanks, NP at 07/10/19 4097                Author: Charlesetta Shanks, NP  Service: --  Author Type: Nurse Practitioner       Filed: 07/10/19 0910  Encounter Date: 07/09/2019  Status: Signed          Editor: Charlesetta Shanks, NP (Nurse Practitioner)          From: Juanita CraverCharlesetta Shanks, NPSent: 07/09/2019  4:44 PM EDTSubject: Non-Urgent Medical QuestionHello Travis Weaver,I have been seen at [facility] 2  times for a very bad cough. Here is what they gave me. I still have a bad cough but not as frequent. I have finished all the medication I was given. Do you think I need to see you?5/26/21Acute Bronchitis Bromfed DM 2 mg-30 mg-10 mg/5 mL oral syrup 6/2/211.  Acute bronchitis? doxycycline monohydrate 100 mg capsule? albuterol sulfate HFA 90 mcg/actuation aerosol inhaler? Medrol (Pak) 4 mg tablets in a dose pack? promethazine-DM 6.25 mg-15 mg/5 mL oral syrup? bronchitis: care  instructions

## 2019-07-22 ENCOUNTER — Encounter (INDEPENDENT_AMBULATORY_CARE_PROVIDER_SITE_OTHER): Payer: Self-pay | Admitting: *Deleted

## 2019-10-29 ENCOUNTER — Encounter

## 2019-10-29 MED ORDER — LINZESS 290 MCG CAPSULE
290 mcg | ORAL_CAPSULE | Freq: Every day | ORAL | 1 refills | Status: DC
Start: 2019-10-29 — End: 2020-03-02

## 2019-10-29 NOTE — Telephone Encounter (Signed)
Came over via fax      Has appt 10-07

## 2019-11-05 ENCOUNTER — Telehealth: Admit: 2019-11-05 | Discharge: 2019-11-05 | Payer: BLUE CROSS/BLUE SHIELD | Attending: Family | Primary: Family

## 2019-11-05 ENCOUNTER — Telehealth: Attending: Family | Primary: Family

## 2019-11-05 DIAGNOSIS — J209 Acute bronchitis, unspecified: Secondary | ICD-10-CM

## 2019-11-05 MED ORDER — PROMETHAZINE-DM 6.25 MG-15 MG/5 ML SYRUP
ORAL | 0 refills | Status: AC | PRN
Start: 2019-11-05 — End: 2019-11-12

## 2019-11-05 MED ORDER — BREO ELLIPTA 100 MCG-25 MCG/DOSE POWDER FOR INHALATION
100-25 mcg/dose | Freq: Every day | RESPIRATORY_TRACT | 1 refills | Status: DC
Start: 2019-11-05 — End: 2019-12-29

## 2019-11-05 NOTE — Progress Notes (Signed)
HISTORY OF PRESENT ILLNESS  Travis Weaver is a 54 y.o. male presents via telemedicine for cough    Patient reported that he developed cough in June and went to [facility], had [facility].  Dx with bronchitis and gave him cough medication and steroids.  Cough then continued and he went back to [facility]- Still had bronchitis. Negative covid test.  Given doxycline, steroid pack and tessalon pearls.   Also has been given an albuterol inhaler.    Patient reported he continues with dry cough.   Denies fevers.        There were no vitals filed for this visit.  Patient Active Problem List   Diagnosis Code   ??? Eczema L30.9   ??? Essential hypertension I10   ??? 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 IBS (irritable bowel syndrome) K58.9   ??? Constipation K59.00   ??? Degenerative disc disease, lumbar M51.36     Patient Active Problem List    Diagnosis Date Noted   ??? Degenerative disc disease, lumbar 04/21/2019   ??? Eczema 10/08/2018   ??? Essential hypertension 10/08/2018   ??? Ventricular premature complex 10/08/2018   ??? IBS (irritable bowel syndrome) 10/08/2018   ??? Constipation 10/08/2018     Current Outpatient Medications   Medication Sig Dispense Refill   ??? fluticasone furoate-vilanteroL (Breo Ellipta) 100-25 mcg/dose inhaler Take 1 Puff by inhalation daily. 60 Each 1   ??? promethazine-dextromethorphan (PROMETHAZINE-DM) 6.25-15 mg/5 mL syrup Take 5 mL by mouth every four (4) hours as needed for Cough for up to 7 days. 150 mL 0   ??? Linzess 290 mcg cap capsule Take 1 Capsule by mouth daily. 90 Capsule 1   ??? famotidine (PEPCID) 20 mg tablet Take 20 mg by mouth daily.     ??? clobetasoL (CLOBEX) 0.05 % lotion Apply  to affected area two (2) times a day. 59 mL 2   ??? lisinopriL (PRINIVIL, ZESTRIL) 20 mg tablet Take 1 Tab by mouth daily. 90 Tab 1     No Known Allergies  Past Medical History:   Diagnosis Date   ??? Contact dermatitis and eczema due to cause    ??? Hypertension      History reviewed. No pertinent surgical history.  Family  History   Problem Relation Age of Onset   ??? Arthritis-osteo Mother    ??? Cancer Mother    ??? Elevated Lipids Mother    ??? Heart Disease Mother    ??? Hypertension Mother    ??? Arthritis-osteo Father    ??? Cancer Father    ??? Elevated Lipids Father    ??? Hypertension Father    ??? Hypertension Sister    ??? Arthritis-osteo Brother      Social History     Tobacco Use   ??? Smoking status: Former Smoker     Packs/day: 1.00     Years: 35.00     Pack years: 35.00     Quit date: 2016     Years since quitting: 5.7   ??? Smokeless tobacco: Never Used   ??? Tobacco comment: Patient vapes   Substance Use Topics   ??? Alcohol use: Never           Review of Systems   Constitutional: Negative for chills and fever.   HENT: Negative.    Respiratory: Positive for cough. Negative for sputum production and shortness of breath.    Cardiovascular: Negative for chest pain and palpitations.   Gastrointestinal: Negative  for heartburn, nausea and vomiting.       Physical Exam  Constitutional:       Appearance: Normal appearance.   HENT:      Head: Normocephalic.   Eyes:      Conjunctiva/sclera: Conjunctivae normal.   Pulmonary:      Effort: Pulmonary effort is normal.      Comments: Dry hacking cough during virtual exam  Skin:     General: Skin is warm and dry.   Neurological:      Mental Status: He is alert and oriented to person, place, and time.   Psychiatric:         Mood and Affect: Mood normal.         Behavior: Behavior normal.           ASSESSMENT and PLAN  Diagnoses and all orders for this visit:    1. Chronic bronchitis with acute exacerbation (HCC)  Comments:  concern for COPD with smoking history.   Start on breo and cough suppresant given.   Orders:  -     fluticasone furoate-vilanteroL (Breo Ellipta) 100-25 mcg/dose inhaler; Take 1 Puff by inhalation daily.  -     promethazine-dextromethorphan (PROMETHAZINE-DM) 6.25-15 mg/5 mL syrup; Take 5 mL by mouth every four (4) hours as needed for Cough for up to 7 days.    2. Personal history of tobacco  use, presenting hazards to health  Comments:  sending for low dose CT scan due to smoking hx and continued cough.  Orders:  -     CT LOW DOSE LUNG CANCER SCREENING; Future         Discussed with patient current guidelines for screening for lung cancer.  Current recommendations are to obtain yearly screening LDCT yearly for age 26-80, or until smoke free for 15 years.  Patient has 35 pack year history of cigarette smoking and currently using vape cigarette.  Quit tobacco cigarettes 5 years ago.  Discussed with patient risks and benefits of screening, including over-diagnosis, false positive rate, and total radiation exposure.  Patient currently exhibits no signs or symptoms suggestive of lung cancer.  Discussed with patient importance of compliance with yearly annual lung cancer screenings and willingness to undergo diagnosis and treatment if screening scan is positive.  In addition, patient was counseled regarding (remaining smoke free/total smoking cessation).  Travis Weaver, who was evaluated through a synchronous (real-time) audio-video encounter, and/or his healthcare decision maker, is aware that it is a billable service, with coverage as determined by his insurance carrier. He provided verbal consent to proceed: Yes, and patient identification was verified. It was conducted pursuant to the emergency declaration under the [employer] and the [employer], 1135 waiver authority and the [employer] Act. A caregiver was present when appropriate. Ability to conduct physical exam was limited. I was at home. The patient was at home.    Travis Weaver is a 54 y.o. male being evaluated by a Virtual Visit (video visit) encounter to address concerns as mentioned above.  A caregiver was present when appropriate. Due to this being a Scientist, research (medical) (During COVID-19 public health emergency), evaluation of the following organ systems was limited:  Vitals/Constitutional/EENT/Resp/CV/GI/GU/MS/Neuro/Skin/Heme-Lymph-Imm.  Pursuant to the emergency declaration under the [facility] Act and the [employer], 1135 waiver authority and the [employer] and [employer] Act, this Virtual Visit was conducted with patient's (and/or legal guardian's) consent, to reduce the risk of exposure to COVID-19  and provide necessary medical care.      Services were provided through a video synchronous discussion virtually to substitute for in-person encounter.    --Charlesetta Shanks, NP on 11/05/2019 at 7:40 AM    An electronic signature was used to authenticate this note.

## 2019-11-05 NOTE — Progress Notes (Signed)
1. Have you been to the ER, urgent care clinic since your last visit?  Hospitalized since your last visit?no    2. Have you seen or consulted any other health care providers outside of the [facility] since your last visit?  Include any pap smears or colon screening. No  Chief Complaint   Patient presents with   . Cough     Pt states having cough since june. Pt states he was diagnosed with bronchitis. The cough went away then it came back. Pt states sometimes he has sputum when he coughs      There were no vitals taken for this visit.

## 2019-11-11 ENCOUNTER — Inpatient Hospital Stay: Admit: 2019-11-11 | Payer: BLUE CROSS/BLUE SHIELD | Primary: Family

## 2019-11-11 DIAGNOSIS — Z122 Encounter for screening for malignant neoplasm of respiratory organs: Secondary | ICD-10-CM

## 2019-11-11 NOTE — Progress Notes (Signed)
Notified patient through portal regarding lab results.

## 2019-11-15 ENCOUNTER — Encounter

## 2019-11-16 MED ORDER — HYDROXYZINE PAMOATE 25 MG CAP
25 mg | ORAL_CAPSULE | Freq: Three times a day (TID) | ORAL | 1 refills | Status: DC | PRN
Start: 2019-11-16 — End: 2020-03-02

## 2019-11-16 NOTE — Telephone Encounter (Signed)
Telephone Encounter by Charlesetta Shanks, NP at 11/16/19 5797                Author: Charlesetta Shanks, NP  Service: --  Author Type: Nurse Practitioner       Filed: 11/16/19 0729  Encounter Date: 11/15/2019  Status: Signed          Editor: Charlesetta Shanks, NP (Nurse Practitioner)          From: Juanita CraverCharlesetta Shanks, NPSent: 11/15/2019  5:22 PM EDTSubject: Prescription QuestionHello Enrique Manganaro,Clay had given me a prescription for  itching a while ago. (Hydroxyzine 25mg ). That worked. Can I get it filled again. Thank you,Jeffory

## 2019-12-01 ENCOUNTER — Encounter

## 2019-12-07 NOTE — Telephone Encounter (Signed)
Telephone Encounter by Charlesetta Shanks, NP at 12/07/19 2008                Author: Charlesetta Shanks, NP  Service: --  Author Type: Nurse Practitioner       Filed: 12/07/19 2008  Encounter Date: 12/01/2019  Status: Signed          Editor: Charlesetta Shanks, NP (Nurse Practitioner)          From: Travis CraverCharlesetta Shanks, NPSent: 12/01/2019  2:11 PM EDTSubject: Non-Urgent Medical QuestionHello Travis Weaver,My bad coughing has come back again.  In the mornings I use the inhaler and that helps for a few hours. I cough through the day and at night. I wake up at night coughing. I am taking OTC cough meds every 4 hours but that doesn't help.

## 2019-12-26 ENCOUNTER — Encounter

## 2019-12-28 MED ORDER — LISINOPRIL 20 MG TAB
20 mg | ORAL_TABLET | ORAL | 0 refills | Status: DC
Start: 2019-12-28 — End: 2020-03-02

## 2019-12-28 NOTE — Telephone Encounter (Signed)
Patient is scheduled for 03/28/2020

## 2019-12-28 NOTE — Telephone Encounter (Signed)
Sent in 90 day supply but due for follow up with labs in the next 3 months, please get him scheduled.

## 2019-12-29 ENCOUNTER — Encounter

## 2019-12-29 MED ORDER — BREO ELLIPTA 100 MCG-25 MCG/DOSE POWDER FOR INHALATION
100-25 mcg/dose | RESPIRATORY_TRACT | 1 refills | Status: DC
Start: 2019-12-29 — End: 2020-03-02

## 2020-03-02 ENCOUNTER — Ambulatory Visit: Admit: 2020-03-02 | Discharge: 2020-03-02 | Payer: BLUE CROSS/BLUE SHIELD | Attending: Family | Primary: Family

## 2020-03-02 ENCOUNTER — Ambulatory Visit: Attending: Family | Primary: Family

## 2020-03-02 DIAGNOSIS — I10 Essential (primary) hypertension: Secondary | ICD-10-CM

## 2020-03-02 MED ORDER — DILTIAZEM ER 240 MG 24 HR CAP
240 mg | ORAL_CAPSULE | Freq: Every day | ORAL | 1 refills | Status: DC
Start: 2020-03-02 — End: 2020-08-22

## 2020-03-02 MED ORDER — HYDROXYZINE PAMOATE 25 MG CAP
25 mg | ORAL_CAPSULE | Freq: Three times a day (TID) | ORAL | 1 refills | Status: DC | PRN
Start: 2020-03-02 — End: 2020-10-10

## 2020-03-02 MED ORDER — LINZESS 290 MCG CAPSULE
290 mcg | ORAL_CAPSULE | Freq: Every day | ORAL | 1 refills | Status: DC
Start: 2020-03-02 — End: 2020-10-27

## 2020-03-02 MED ORDER — PANTOPRAZOLE 40 MG TAB, DELAYED RELEASE
40 mg | ORAL_TABLET | Freq: Every day | ORAL | 0 refills | Status: DC
Start: 2020-03-02 — End: 2020-03-25

## 2020-03-02 NOTE — Progress Notes (Signed)
HISTORY OF PRESENT ILLNESS  Travis Weaver is a 55 y.o. male presents for medication refill.     Hypertension: Patients BP medications were changed by pulmonology to diltiazem.  Denies headaches, blurred vision or dizziness.      Chronic cough:  Patient has had a chronic cough that flares up in intensity over the past 6 months.  Patient has been treated for multiple rounds of steroids/antibiotics for bronchitis.  Patient had low dose CT scan showed mild emphysema.  Started on breo and referred to pulmonology.  Dr. Henrene Pastor changed breo to symbicort and also switched lisinopril to diltiazem thinking that the lisinopril was causing the cough.   Patient reported he still has nagging dry cough.  Also uses famotidine daily.       IBS-C:  Well controlled on linzess.   Patient has been taking fiber supplement.  Does not enjoy vegetables.     Itching:  Gets itching, sometimes at night to axilla and back/scalp.  Uses hydrocortisone and also hydroxyzine PRN      Vitals:    03/02/20 0751 03/02/20 0820   BP: (!) 143/79 130/76   Pulse: 75    Resp: 16    Temp: 98.1 ??F (36.7 ??C)    SpO2: 98%    Weight: 192 lb 6.4 oz (87.3 kg)    Height: 5' 6.5" (1.689 m)      Patient Active Problem List   Diagnosis Code   ??? Eczema L30.9   ??? Essential hypertension I10   ??? 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 IBS (irritable bowel syndrome) K58.9   ??? Constipation K59.00   ??? Degenerative disc disease, lumbar M51.36   ??? Chronic bronchitis with acute exacerbation (HCC) J20.9, J42     Patient Active Problem List    Diagnosis Date Noted   ??? Chronic bronchitis with acute exacerbation (HCC) 11/05/2019   ??? Degenerative disc disease, lumbar 04/21/2019   ??? Eczema 10/08/2018   ??? Essential hypertension 10/08/2018   ??? Ventricular premature complex 10/08/2018   ??? IBS (irritable bowel syndrome) 10/08/2018   ??? Constipation 10/08/2018     Current Outpatient Medications   Medication Sig Dispense Refill   ??? Symbicort 160-4.5 mcg/actuation HFAA INHALE 2 PUFFS BY MOUTH  EVERY AM AND 2 PUFFS EVERY PM.RINSE MOUTH AFTER EACH USE.     ??? dilTIAZem ER (CARDIZEM CD) 240 mg capsule Take 1 Capsule by mouth daily. 90 Capsule 1   ??? Linzess 290 mcg cap capsule Take 1 Capsule by mouth daily. 90 Capsule 1   ??? hydrOXYzine pamoate (VISTARIL) 25 mg capsule Take 1 Capsule by mouth three (3) times daily as needed for Itching. 30 Capsule 1   ??? pantoprazole (PROTONIX) 40 mg tablet Take 1 Tablet by mouth daily. 30 Tablet 0   ??? clobetasoL (CLOBEX) 0.05 % lotion Apply  to affected area two (2) times a day. 59 mL 2   ??? benzonatate (TESSALON) 100 mg capsule Take 100 mg by mouth three (3) times daily.       No Known Allergies  Past Medical History:   Diagnosis Date   ??? Contact dermatitis and eczema due to cause    ??? Hypertension      History reviewed. No pertinent surgical history.  Family History   Problem Relation Age of Onset   ??? OSTEOARTHRITIS Mother    ??? Cancer Mother    ??? Elevated Lipids Mother    ??? Heart Disease Mother    ??? Hypertension Mother    ???  OSTEOARTHRITIS Father    ??? Cancer Father    ??? Elevated Lipids Father    ??? Hypertension Father    ??? Hypertension Sister    ??? OSTEOARTHRITIS Brother      Social History     Tobacco Use   ??? Smoking status: Former Smoker     Packs/day: 1.00     Years: 35.00     Pack years: 35.00     Quit date: 2016     Years since quitting: 6.0   ??? Smokeless tobacco: Never Used   ??? Tobacco comment: Patient vapes   Substance Use Topics   ??? Alcohol use: Never           Review of Systems   Constitutional: Negative for malaise/fatigue and weight loss.   Eyes: Negative for blurred vision and double vision.   Respiratory: Positive for cough. Negative for sputum production, shortness of breath and wheezing.    Cardiovascular: Negative for chest pain and palpitations.   Gastrointestinal: Negative.    Genitourinary: Negative.    Neurological: Negative for dizziness, tingling, tremors and headaches.   Psychiatric/Behavioral: The patient is not nervous/anxious and does not have insomnia.         Physical Exam  Constitutional:       Appearance: Normal appearance.   HENT:      Head: Normocephalic and atraumatic.   Eyes:      Extraocular Movements: Extraocular movements intact.      Conjunctiva/sclera: Conjunctivae normal.      Pupils: Pupils are equal, round, and reactive to light.   Cardiovascular:      Rate and Rhythm: Normal rate and regular rhythm.      Pulses: Normal pulses.   Pulmonary:      Effort: Pulmonary effort is normal.      Breath sounds: Normal breath sounds.   Musculoskeletal:         General: Normal range of motion.   Skin:     General: Skin is warm and dry.   Neurological:      General: No focal deficit present.      Mental Status: He is alert and oriented to person, place, and time.   Psychiatric:         Mood and Affect: Mood normal.         Behavior: Behavior normal.           ASSESSMENT and PLAN  Diagnoses and all orders for this visit:    1. Essential hypertension  Comments:  well controlled on dilitazem.   Orders:  -     dilTIAZem ER (CARDIZEM CD) 240 mg capsule; Take 1 Capsule by mouth daily.  -     METABOLIC PANEL, COMPREHENSIVE  -     LIPID PANEL    2. Irritable bowel syndrome with constipation  Comments:  well controlled on linzess.   Orders:  -     Linzess 290 mcg cap capsule; Take 1 Capsule by mouth daily.    3. Itching  -     hydrOXYzine pamoate (VISTARIL) 25 mg capsule; Take 1 Capsule by mouth three (3) times daily as needed for Itching.    4. Chronic cough  Comments:  try switching to PPI for 2 weeks to see improvement.  Patient is going to journal flare ups.   Orders:  -     pantoprazole (PROTONIX) 40 mg tablet; Take 1 Tablet by mouth daily.  -     CBC WITH AUTOMATED DIFF  5. GERD without esophagitis  -     pantoprazole (PROTONIX) 40 mg tablet; Take 1 Tablet by mouth daily.  -     CBC WITH AUTOMATED DIFF    6. Chronic bronchitis with acute exacerbation [facility])  Comments:  records requested from pulmonology for spirometry and labs checked there.          Charlesetta Shanks, NP

## 2020-03-02 NOTE — Progress Notes (Signed)
The 10-year ASCVD risk score Denman George DC Montez Hageman., et al., 2013) is: 5.5%    Values used to calculate the score:      Age: 55 years      Sex: Male      Is Non-Hispanic African American: No      Diabetic: No      Tobacco smoker: No      Systolic Blood Pressure: 130 mmHg      Is BP treated: Yes      HDL Cholesterol: 79 mg/dL      Total Cholesterol: 219 mg/dL

## 2020-03-02 NOTE — Progress Notes (Signed)
 Chief Complaint   Patient presents with   . Follow Up Chronic Condition     6 month follow up        1. Have you been to the ER, urgent care clinic since your last visit?  Hospitalized since your last visit? No     2. Have you seen or consulted any other health care providers outside of the [facility] since your last visit?  Include any pap smears or colon screening. Yes, pulmonology     Visit Vitals  BP (!) 143/79 (BP 1 Location: Left upper arm, BP Patient Position: Sitting, BP Cuff Size: Adult)   Pulse 75   Temp 98.1 F (36.7 C)   Resp 16   Ht 5' 6.5 (1.689 m)   Wt 192 lb 6.4 oz (87.3 kg)   SpO2 98%   BMI 30.59 kg/m

## 2020-03-03 LAB — CBC WITH AUTO DIFFERENTIAL
Basophils %: 1 %
Basophils Absolute: 0.1 10*3/uL (ref 0.0–0.2)
Eosinophils %: 1 %
Eosinophils Absolute: 0.1 10*3/uL (ref 0.0–0.4)
Granulocyte Absolute Count: 0 10*3/uL (ref 0.0–0.1)
Hematocrit: 42.5 % (ref 37.5–51.0)
Hemoglobin: 14.2 g/dL (ref 13.0–17.7)
Immature Granulocytes: 0 %
Lymphocytes %: 29 %
Lymphocytes Absolute: 2.4 10*3/uL (ref 0.7–3.1)
MCH: 30.1 pg (ref 26.6–33.0)
MCHC: 33.4 g/dL (ref 31.5–35.7)
MCV: 90 fL (ref 79–97)
Monocytes %: 8 %
Monocytes Absolute: 0.7 10*3/uL (ref 0.1–0.9)
Neutrophils %: 61 %
Neutrophils Absolute: 5.1 10*3/uL (ref 1.4–7.0)
Platelets: 381 10*3/uL (ref 150–450)
RBC: 4.71 x10E6/uL (ref 4.14–5.80)
RDW: 13.1 % (ref 11.6–15.4)
WBC: 8.4 10*3/uL (ref 3.4–10.8)

## 2020-03-03 LAB — COMPREHENSIVE METABOLIC PANEL
ALT: 23 IU/L (ref 0–44)
AST: 17 IU/L (ref 0–40)
Albumin/Globulin Ratio: 2.2 NA (ref 1.2–2.2)
Albumin: 4.9 g/dL (ref 3.8–4.9)
Alkaline Phosphatase: 46 IU/L (ref 44–121)
BUN: 22 mg/dL (ref 6–24)
Bun/Cre Ratio: 25 NA — ABNORMAL HIGH (ref 9–20)
CO2: 27 mmol/L (ref 20–29)
Calcium: 10.2 mg/dL (ref 8.7–10.2)
Chloride: 106 mmol/L (ref 96–106)
Creatinine: 0.89 mg/dL (ref 0.76–1.27)
EGFR IF NonAfrican American: 96 mL/min/{1.73_m2} (ref >59)
GFR African American: 110 mL/min/{1.73_m2} (ref >59)
Globulin, Total: 2.2 g/dL (ref 1.5–4.5)
Glucose: 88 mg/dL (ref 65–99)
Potassium: 4.8 mmol/L (ref 3.5–5.2)
Sodium: 144 mmol/L (ref 134–144)
Total Bilirubin: <0.2 mg/dL (ref 0.0–1.2)
Total Protein: 7.1 g/dL (ref 6.0–8.5)

## 2020-03-03 LAB — LIPID PANEL
Cholesterol, Total: 219 mg/dL — ABNORMAL HIGH (ref 100–199)
Cholesterol, total: 219 mg/dL — ABNORMAL HIGH (ref 100–199)
HDL Cholesterol: 79 mg/dL (ref >39)
HDL: 79 mg/dL (ref >39)
LDL Calculated: 118 mg/dL — ABNORMAL HIGH (ref 0–99)
LDL, calculated: 118 mg/dL — ABNORMAL HIGH (ref 0–99)
Triglyceride: 126 mg/dL (ref 0–149)
Triglycerides: 126 mg/dL (ref 0–149)
VLDL, calculated: 22 mg/dL (ref 5–40)
VLDL: 22 mg/dL (ref 5–40)

## 2020-03-03 LAB — METABOLIC PANEL, COMPREHENSIVE
A-G Ratio: 2.2 (ref 1.2–2.2)
ALT (SGPT): 23 IU/L (ref 0–44)
AST (SGOT): 17 IU/L (ref 0–40)
Albumin: 4.9 g/dL (ref 3.8–4.9)
Alk. phosphatase: 46 IU/L (ref 44–121)
BUN/Creatinine ratio: 25 — ABNORMAL HIGH (ref 9–20)
BUN: 22 mg/dL (ref 6–24)
Bilirubin, total: <0.2 mg/dL (ref 0.0–1.2)
CO2: 27 mmol/L (ref 20–29)
Calcium: 10.2 mg/dL (ref 8.7–10.2)
Chloride: 106 mmol/L (ref 96–106)
Creatinine: 0.89 mg/dL (ref 0.76–1.27)
GFR est AA: 110 mL/min/{1.73_m2} (ref >59)
GFR est non-AA: 96 mL/min/{1.73_m2} (ref >59)
GLOBULIN, TOTAL: 2.2 g/dL (ref 1.5–4.5)
Glucose: 88 mg/dL (ref 65–99)
Potassium: 4.8 mmol/L (ref 3.5–5.2)
Protein, total: 7.1 g/dL (ref 6.0–8.5)
Sodium: 144 mmol/L (ref 134–144)

## 2020-03-03 LAB — CBC WITH AUTOMATED DIFF
ABS. BASOPHILS: 0.1 10*3/uL (ref 0.0–0.2)
ABS. EOSINOPHILS: 0.1 10*3/uL (ref 0.0–0.4)
ABS. IMM. GRANS.: 0 10*3/uL (ref 0.0–0.1)
ABS. MONOCYTES: 0.7 10*3/uL (ref 0.1–0.9)
ABS. NEUTROPHILS: 5.1 10*3/uL (ref 1.4–7.0)
Abs Lymphocytes: 2.4 10*3/uL (ref 0.7–3.1)
BASOPHILS: 1 %
EOSINOPHILS: 1 %
HCT: 42.5 % (ref 37.5–51.0)
HGB: 14.2 g/dL (ref 13.0–17.7)
IMMATURE GRANULOCYTES: 0 %
Lymphocytes: 29 %
MCH: 30.1 pg (ref 26.6–33.0)
MCHC: 33.4 g/dL (ref 31.5–35.7)
MCV: 90 fL (ref 79–97)
MONOCYTES: 8 %
NEUTROPHILS: 61 %
PLATELET: 381 10*3/uL (ref 150–450)
RBC: 4.71 x10E6/uL (ref 4.14–5.80)
RDW: 13.1 % (ref 11.6–15.4)
WBC: 8.4 10*3/uL (ref 3.4–10.8)

## 2020-03-14 ENCOUNTER — Encounter

## 2020-03-15 NOTE — Telephone Encounter (Signed)
Telephone Encounter by Charlesetta Shanks, NP at 03/15/20 1314                Author: Charlesetta Shanks, NP  Service: --  Author Type: Nurse Practitioner       Filed: 03/15/20 1314  Encounter Date: 03/14/2020  Status: Signed          Editor: Charlesetta Shanks, NP (Nurse Practitioner)          From: Travis CraverCharlesetta Shanks, NPSent: 03/14/2020  7:46 AM ESTSubject: Coughing UpdateHello Travis Weaver,I am still working nights. I copied my journal.Coughing  Journal2/5 Started hard a few times a day2/6 A little more frequent but same duration. 2/7 A little more frequent but same duration. 2/8 Gone2/13 Starting coming back. Severity and frequency increasing. 2/14 It is really bad like was before.    Throat,  neck and stomach are getting sore from all the coughing.Is there something we can do?

## 2020-03-17 ENCOUNTER — Encounter

## 2020-03-18 MED ORDER — SHINGRIX (PF) 50 MCG/0.5 ML INTRAMUSCULAR SUSPENSION, KIT
50 mcg/0.5 mL | Freq: Once | INTRAMUSCULAR | 1 refills | Status: AC
Start: 2020-03-18 — End: 2020-03-18

## 2020-03-18 NOTE — Telephone Encounter (Signed)
Telephone Encounter by Charlesetta Shanks, NP at 03/18/20 1233                Author: Charlesetta Shanks, NP  Service: --  Author Type: Nurse Practitioner       Filed: 03/18/20 1233  Encounter Date: 03/17/2020  Status: Signed          Editor: Charlesetta Shanks, NP (Nurse Practitioner)          From: Juanita CraverCharlesetta Shanks, NPSent: 03/17/2020  4:54 PM ESTSubject: Shingrix VaccineHello Mariella Blackwelder,Can you send a prescription to [facility] for the  Shingrix vaccine.

## 2020-03-25 ENCOUNTER — Encounter

## 2020-03-25 MED ORDER — PANTOPRAZOLE 40 MG TAB, DELAYED RELEASE
40 mg | ORAL_TABLET | ORAL | 0 refills | Status: DC
Start: 2020-03-25 — End: 2020-04-20

## 2020-03-28 ENCOUNTER — Encounter: Attending: Family | Primary: Family

## 2020-04-02 ENCOUNTER — Encounter

## 2020-04-08 ENCOUNTER — Ambulatory Visit: Admit: 2020-04-08 | Discharge: 2020-04-08 | Payer: BLUE CROSS/BLUE SHIELD | Attending: Specialist | Primary: Family

## 2020-04-08 ENCOUNTER — Ambulatory Visit: Attending: Specialist | Primary: Family

## 2020-04-08 DIAGNOSIS — J040 Acute laryngitis: Secondary | ICD-10-CM

## 2020-04-08 NOTE — Progress Notes (Signed)
Progress  Notes by Kalman Jewels, MD at 04/08/20 1430                Author: Kalman Jewels, MD  Service: --  Author Type: Physician       Filed: 04/12/20 1426  Encounter Date: 04/08/2020  Status: Signed          Editor: Kalman Jewels, MD (Physician)                 Subjective:            Travis Weaver    55 y.o.    January 30, 1966       New Patient Visit   55 year old man with history of a bad cough in June 2021 that lasted for about 2 weeks.  Covid testing at that time was negative.  He was placed on antibiotic and cough syrup without much change but  the cough resolved.  He had recurrence about a month later and was once again placed on an different antibiotic and cough medicine and a CT scan of the chest was negative.  He had another episode and was seen by pulmonologist who stopped his lisinopril  and this did not seem to make a difference.  Patient still has a nonproductive cough that comes on from time to time.  He takes famotidine 40 mg and was told in the past he had some irritation of his vocal cords from acid.  Patient does drink caffeine  on a regular basis.                Review of Systems   ROS             Heent: No diplopia, no hearing loss, no tinnitis, no nasal congestion, no sinus pain, no dysphygia, no sore throat.   Neck:  No neck mass, no neck pain   Respiratory:  No cough, no hemoptysis, no SOB, no wheezing   CV:  No chest pain, no arrythmias, no syncope   GI:  No nausea, no vomiting, no abdominal pain   Neuro:  No headache, no loss of consciousness, no paralysis, no weakness         Physical Exam      General: NAD, well-developed well-nourished  Eyes: PERRLA, EOMs intact  Ears: External canals clear, TMs:  Clear, Tuning fork exam normal   Septum midline, turbinates normal, mucosa normal, no external deformity  Mouth: Mucosa normal, tongue normal, floor of mouth normal  Throat: Clear, tonsils absent  Neck: Supple without masses, no bruits   Neuro: Cranial nerves II through  XII grossly  intact      Fiberoptic laryngoscopy: After proper consent and under topical anesthesia the flexible scope was passed into the nose.  The nasopharynx and hypopharynx are clear.  The larynx exhibits some cobblestoning and mild edema of the posterior commissure area  adenoids.  Vocal cords are clear with good vocal cord mobility.  Subglottis is clear.  Patient tolerated procedure well.         Past Medical History:        Diagnosis  Date         ?  Contact dermatitis and eczema due to cause           ?  Hypertension          History reviewed. No pertinent surgical history.      Family History         Problem  Relation  Age of  Onset          ?  OSTEOARTHRITIS  Mother       ?  Cancer  Mother       ?  Elevated Lipids  Mother       ?  Heart Disease  Mother       ?  Hypertension  Mother       ?  OSTEOARTHRITIS  Father       ?  Cancer  Father       ?  Elevated Lipids  Father       ?  Hypertension  Father       ?  Hypertension  Sister            ?  OSTEOARTHRITIS  Brother            Social History          Tobacco Use         ?  Smoking status:  Former Smoker              Packs/day:  1.00         Years:  35.00         Pack years:  35.00         Quit date:  2016         Years since quitting:  6.1         ?  Smokeless tobacco:  Never Used        ?  Tobacco comment: Patient vapes       Substance Use Topics         ?  Alcohol use:  Never           Prior to Admission medications             Medication  Sig  Start Date  End Date  Taking?  Authorizing Provider            pantoprazole (PROTONIX) 40 mg tablet  TAKE 1 TABLET BY MOUTH EVERY DAY  03/25/20    Yes  Charlesetta Shanks, NP     Symbicort 160-4.5 mcg/actuation HFAA  INHALE 2 PUFFS BY MOUTH EVERY AM AND 2 PUFFS EVERY PM.RINSE MOUTH AFTER EACH USE.  02/21/20    Yes  Provider, Historical     benzonatate (TESSALON) 100 mg capsule  Take 100 mg by mouth three (3) times daily.  01/25/20    Yes  Provider, Historical     dilTIAZem ER (CARDIZEM CD) 240 mg capsule  Take 1 Capsule by mouth  daily.  03/02/20    Yes  Charlesetta Shanks, NP     Linzess 290 mcg cap capsule  Take 1 Capsule by mouth daily.  03/02/20    Yes  Charlesetta Shanks, NP     hydrOXYzine pamoate (VISTARIL) 25 mg capsule  Take 1 Capsule by mouth three (3) times daily as needed for Itching.  03/02/20    Yes  Charlesetta Shanks, NP            clobetasoL (CLOBEX) 0.05 % lotion  Apply  to affected area two (2) times a day.  04/21/19    Yes  Charlesetta Shanks, NP                                Objective:        Visit Vitals      BP  130/60 (BP 1  Location: Left upper arm, BP Patient Position: Sitting, BP Cuff Size: Large adult)     Pulse  76     Temp  97.7 ??F (36.5 ??C) (Temporal)     Resp  16     Ht  5\' 6"  (1.676 m)     Wt  192 lb (87.1 kg)     SpO2  98%        BMI  30.99 kg/m??            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         ?  Reflux laryngitis  Yes          Orders Placed This Encounter        ?  LARYNGOSCOPY,FLEX FIBER,DIAGNOSTIC          Follow-up and Dispositions      ??  Return if symptoms worsen or fail to improve.                         Mathew Postiglione B. Teleshia Lemere, MD, FACS   [facility]      [address]   [place], [place]      O [phone]

## 2020-04-08 NOTE — Telephone Encounter (Signed)
Pt called stating that Dr. Sherlyn Lick wanted him to double the amount of famotidine that he takes daily but incorrectly told Dr. Sherlyn Lick how many milligrams he takes a day during his appointment. He stated he actually takes 20mg  once a day and would like to know if Dr. Sherlyn Lick would still like him to double that.

## 2020-04-08 NOTE — Progress Notes (Signed)
 Progress  Notes by Fairy Planas, LPN at 96/88/77 1430                Author: Fairy Planas, LPN  Service: --  Author Type: Licensed Nurse       Filed: 04/12/20 1426  Encounter Date: 04/08/2020  Status: Signed          Editor: Fairy Planas, LPN (Licensed Nurse)               Visit Vitals      BP  130/60 (BP 1 Location: Left upper arm, BP Patient Position: Sitting, BP Cuff Size: Large adult)        Pulse  76     Temp  97.7 F (36.5 C) (Temporal)     Resp  16     Ht  5' 6 (1.676 m)     Wt  192 lb (87.1 kg)     SpO2  98%        BMI  30.99 kg/m          Chief Complaint       Patient presents with        ?  New Patient             chronic cough

## 2020-04-11 NOTE — Telephone Encounter (Signed)
Unable to make contact with pt.

## 2020-04-19 ENCOUNTER — Encounter

## 2020-04-20 ENCOUNTER — Ambulatory Visit: Admit: 2020-04-20 | Discharge: 2020-04-20 | Payer: BLUE CROSS/BLUE SHIELD | Attending: Family | Primary: Family

## 2020-04-20 ENCOUNTER — Ambulatory Visit: Attending: Family | Primary: Family

## 2020-04-20 DIAGNOSIS — J209 Acute bronchitis, unspecified: Secondary | ICD-10-CM

## 2020-04-20 MED ORDER — PREDNISONE 20 MG TAB
20 mg | ORAL_TABLET | ORAL | 0 refills | Status: AC
Start: 2020-04-20 — End: 2020-04-29

## 2020-04-20 MED ORDER — PROMETHAZINE-CODEINE 6.25 MG-10 MG/5 ML SYRUP
Freq: Four times a day (QID) | ORAL | 0 refills | Status: AC | PRN
Start: 2020-04-20 — End: 2020-04-23

## 2020-04-20 NOTE — Progress Notes (Signed)
HISTORY OF PRESENT ILLNESS  Travis Weaver is a 55 y.o. male presents for cough/congestion.    Patient reported that he developed cough, congestion and nasal congestion x10 days.  Patient also notes that he is having sneezing.  Patient reported that he is using tessalon pearls and combo cold medication OTC every 4 hours.  Denies fevers.  Patient has been dealing with issues with chronic cough for almost a year but notes that this feels different than that.     Vitals:    04/20/20 1534   BP: 138/80   Pulse: 75   Resp: 18   Temp: 97.5 ??F (36.4 ??C)   TempSrc: Temporal   SpO2: 98%   Weight: 196 lb (88.9 kg)   Height: 5\' 6"  (1.676 m)     Patient Active Problem List   Diagnosis Code   ??? Eczema L30.9   ??? Essential hypertension I10   ??? 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 IBS (irritable bowel syndrome) K58.9   ??? Constipation K59.00   ??? Degenerative disc disease, lumbar M51.36   ??? Chronic bronchitis with acute exacerbation (HCC) J20.9, J42     Patient Active Problem List    Diagnosis Date Noted   ??? Chronic bronchitis with acute exacerbation (HCC) 11/05/2019   ??? Degenerative disc disease, lumbar 04/21/2019   ??? Eczema 10/08/2018   ??? Essential hypertension 10/08/2018   ??? Ventricular premature complex 10/08/2018   ??? IBS (irritable bowel syndrome) 10/08/2018   ??? Constipation 10/08/2018     Current Outpatient Medications   Medication Sig Dispense Refill   ??? famotidine (PEPCID) 40 mg tablet Take 80 mg by mouth daily.     ??? promethazine-codeine (PHENERGAN with CODEINE) 6.25-10 mg/5 mL syrup Take 5 mL by mouth every six (6) hours as needed for Cough for up to 3 days. Max Daily Amount: 20 mL. 50 mL 0   ??? predniSONE (DELTASONE) 20 mg tablet Take 60 mg by mouth daily for 3 days, THEN 40 mg daily for 3 days, THEN 20 mg daily for 3 days. 18 Tablet 0   ??? Symbicort 160-4.5 mcg/actuation HFAA INHALE 2 PUFFS BY MOUTH EVERY AM AND 2 PUFFS EVERY PM.RINSE MOUTH AFTER EACH USE.     ??? benzonatate (TESSALON) 100 mg capsule Take 100 mg by mouth  three (3) times daily.     ??? dilTIAZem ER (CARDIZEM CD) 240 mg capsule Take 1 Capsule by mouth daily. 90 Capsule 1   ??? Linzess 290 mcg cap capsule Take 1 Capsule by mouth daily. 90 Capsule 1   ??? hydrOXYzine pamoate (VISTARIL) 25 mg capsule Take 1 Capsule by mouth three (3) times daily as needed for Itching. 30 Capsule 1   ??? clobetasoL (CLOBEX) 0.05 % lotion Apply  to affected area two (2) times a day. 59 mL 2     No Known Allergies  Past Medical History:   Diagnosis Date   ??? Contact dermatitis and eczema due to cause    ??? Hypertension      History reviewed. No pertinent surgical history.  Family History   Problem Relation Age of Onset   ??? OSTEOARTHRITIS Mother    ??? Cancer Mother    ??? Elevated Lipids Mother    ??? Heart Disease Mother    ??? Hypertension Mother    ??? OSTEOARTHRITIS Father    ??? Cancer Father    ??? Elevated Lipids Father    ??? Hypertension Father    ??? Hypertension Sister    ???  OSTEOARTHRITIS Brother      Social History     Tobacco Use   ??? Smoking status: Former Smoker     Packs/day: 1.00     Years: 35.00     Pack years: 35.00     Quit date: 2016     Years since quitting: 6.2   ??? Smokeless tobacco: Never Used   ??? Tobacco comment: Patient vapes   Substance Use Topics   ??? Alcohol use: Never           Review of Systems   Constitutional: Negative for chills and fever.   HENT: Positive for congestion and sinus pain. Negative for ear pain and sore throat.    Respiratory: Positive for cough. Negative for shortness of breath.    Cardiovascular: Negative for chest pain and palpitations.   Gastrointestinal: Negative for diarrhea, nausea and vomiting.   Musculoskeletal: Negative for myalgias.       Physical Exam  Constitutional:       Appearance: Normal appearance.   HENT:      Head: Normocephalic.      Right Ear: Tympanic membrane, ear canal and external ear normal.      Left Ear: Tympanic membrane, ear canal and external ear normal.      Nose: Rhinorrhea present.      Mouth/Throat:      Mouth: Mucous membranes are  moist.      Pharynx: Oropharynx is clear. Posterior oropharyngeal erythema present.   Eyes:      Conjunctiva/sclera: Conjunctivae normal.   Cardiovascular:      Rate and Rhythm: Normal rate and regular rhythm.      Pulses: Normal pulses.      Heart sounds: Normal heart sounds.   Pulmonary:      Effort: Pulmonary effort is normal.      Comments: Dry cough  Skin:     General: Skin is warm and dry.   Neurological:      Mental Status: He is alert and oriented to person, place, and time.   Psychiatric:         Mood and Affect: Mood normal.         Behavior: Behavior normal.           ASSESSMENT and PLAN  Diagnoses and all orders for this visit:    1. Chronic bronchitis with acute exacerbation (HCC)  Comments:  treat with prednisone and codiene cough syrup for cough.  Orders:  -     promethazine-codeine (PHENERGAN with CODEINE) 6.25-10 mg/5 mL syrup; Take 5 mL by mouth every six (6) hours as needed for Cough for up to 3 days. Max Daily Amount: 20 mL.  -     predniSONE (DELTASONE) 20 mg tablet; Take 60 mg by mouth daily for 3 days, THEN 40 mg daily for 3 days, THEN 20 mg daily for 3 days.    2. Environmental and seasonal allergies  Comments:  start an antihistamine.      Last PDMP Loraine Leriche as Reviewed:  Review User Review Instant Review Result   [place], Yenifer Saccente 04/20/2020  4:00 PM Reviewed PDMP [1]         Charlesetta Shanks, NP

## 2020-04-20 NOTE — Progress Notes (Signed)
 Chief Complaint   Patient presents with   . Cold     pt states having cough, drainage, mucus, pt states also having sleep issues. pt states otc meds are not working. pt states feeling like this for a week in a half       1. Have you been to the ER, urgent care clinic since your last visit?  Hospitalized since your last visit?no    2. Have you seen or consulted any other health care providers outside of the [facility] since your last visit?  Include any pap smears or colon screening. no    Visit Vitals  BP 138/80 (BP 1 Location: Right arm, BP Patient Position: At rest, BP Cuff Size: Adult)   Pulse 75   Temp 97.5 F (36.4 C) (Temporal)   Resp 18   Ht 5' 6 (1.676 m)   Wt 196 lb (88.9 kg)   SpO2 98%   BMI 31.64 kg/m

## 2020-07-05 NOTE — Telephone Encounter (Signed)
Patient missed his physical therapy on May 4th, 2022.

## 2020-07-15 NOTE — Telephone Encounter (Signed)
Attempted to reach Travis Weaver to confirm next appointment and left a voicemail asking the patient to call back to confirm.

## 2020-07-15 NOTE — Telephone Encounter (Signed)
Formatting of this note might be different from the original.  Attempted to reach Pilar Jarvis to confirm next appointment and left a voicemail asking the patient to call back to confirm.    Electronically signed by Benjaman Kindler at 07/15/2020 11:55 AM EDT

## 2020-07-18 ENCOUNTER — Ambulatory Visit: Admit: 2020-07-18 | Discharge: 2020-07-18 | Payer: BLUE CROSS/BLUE SHIELD | Attending: Otolaryngology | Primary: Family

## 2020-07-18 ENCOUNTER — Ambulatory Visit: Attending: Otolaryngology | Primary: Family

## 2020-07-18 DIAGNOSIS — J040 Acute laryngitis: Secondary | ICD-10-CM

## 2020-07-18 MED ORDER — OMEPRAZOLE 40 MG CAP, DELAYED RELEASE
40 mg | ORAL_CAPSULE | Freq: Every day | ORAL | 1 refills | Status: DC
Start: 2020-07-18 — End: 2020-10-13

## 2020-07-18 MED ORDER — FLUTICASONE 50 MCG/ACTUATION NASAL SPRAY, SUSP
50 mcg/actuation | Freq: Every day | NASAL | 1 refills | Status: DC
Start: 2020-07-18 — End: 2020-08-10

## 2020-07-18 NOTE — Progress Notes (Signed)
Otolaryngology-Head and Neck Surgery  Follow Up Patient Visit     Patient: Travis Weaver  Date of Birth: 14-Aug-1965  MRN: [id number]  Date of Service:  07/18/2020    Chief Complaint: Chronic cough     History of Present Illness: Travis Weaver is a 55 y.o. year old male who was last seen by Dr Sherlyn Lick for chronic cough a few months ago    Over the last 1 year, and developed chronic cough   Intermittent, largely dry  Very bothersome, worse at night     Has tried consistent zyrtec without relief  Has seen pulm - Dr Mitzi Hansen; had neg chest CT and pulm function testing   Was taken off lisinopril   Takes pepcid 20 mg at baseline; saw Dr Sherlyn Lick - increased to 40 mg BID, without change     Denies post nasal drip, allergies, GERD concerns       Past Medical History:  Past Medical History:   Diagnosis Date   ??? Contact dermatitis and eczema due to cause    ??? Hypertension        Past Surgical History:   History reviewed. No pertinent surgical history.    Medications:   Current Outpatient Medications   Medication Instructions   ??? benzonatate (TESSALON) 100 mg, Oral, 3 TIMES DAILY   ??? clobetasoL (CLOBEX) 0.05 % lotion Topical, 2 TIMES DAILY   ??? dilTIAZem ER (CARDIZEM CD) 240 mg, Oral, DAILY   ??? famotidine (PEPCID) 80 mg, Oral, DAILY   ??? hydrOXYzine pamoate (VISTARIL) 25 mg, Oral, 3 TIMES DAILY AS NEEDED   ??? Linzess 290 mcg, Oral, DAILY   ??? Symbicort 160-4.5 mcg/actuation HFAA INHALE 2 PUFFS BY MOUTH EVERY AM AND 2 PUFFS EVERY PM.RINSE MOUTH AFTER EACH USE.       Allergies:   No Known Allergies    Social History:   Social History     Tobacco Use   ??? Smoking status: Former Smoker     Packs/day: 1.00     Years: 35.00     Pack years: 35.00     Quit date: 2016     Years since quitting: 6.4   ??? Smokeless tobacco: Never Used   ??? Tobacco comment: Patient vapes   Vaping Use   ??? Vaping Use: Every day   ??? Substances: Nicotine   ??? Devices: Refillable tank   Substance Use Topics   ??? Alcohol use: Never   ??? Drug use: Never       Family  History:  Family History   Problem Relation Age of Onset   ??? OSTEOARTHRITIS Mother    ??? Cancer Mother    ??? Elevated Lipids Mother    ??? Heart Disease Mother    ??? Hypertension Mother    ??? OSTEOARTHRITIS Father    ??? Cancer Father    ??? Elevated Lipids Father    ??? Hypertension Father    ??? Hypertension Sister    ??? OSTEOARTHRITIS Brother        Review of Systems:  Consitutional: denies fever, excessive weight gain or loss.  Eyes: denies diplopia, eye pain.  Integumentary: denies new concerning skin lesions.  Ears, Nose, Mouth, Throat: denies except as per HPI.  Endocrine: denies hot or cold intolerance, increased thirst.  Respiratory: denies cough, hemoptysis, wheezing  Gastrointestinal: denies trouble swallowing, nausea, emesis, regurgitation  Musculoskeletal: denies muscle weakness or wasting  Cardiovascular: denies chest pain, shortness of breath  Neurologic: denies seizures, numbness or tingling,  syncope  Hematologic: denies easy bleeding or bruising    Physical Examination:   Vitals:    07/18/20 1433   BP: 136/78   Pulse: 80   Resp: 20   Height: 5\' 6"  (1.676 m)   Weight: 192 lb 9.6 oz (87.4 kg)   SpO2: 96%         General: Comfortable, pleasant, appears stated age  Voice: Strong, speaking in full sentences, no stridor. Intermittent harsh dry cough  Face: No masses or lesions, facial strength symmetric   Ears: External ears unremarkable. Bilateral ear canal clear. Tympanic membrane clear and intact, with visible landmarks. Clear middle ear space  Nose: External nose unremarkable. Dorsum midline. Anterior rhinoscopy demonstrates no lesions. Septum midline. Turbinates without hypertrophy.  Oral Cavity / Oropharynx: No trismus. Mucosa pink and moist. No lesions. Tongue is midline and mobile. Palate elevates symmetrically. Uvula midline. Tonsils unremarkable. Base of tongue soft. Floor of mouth soft.   Neck: Supple. No adenopathy. Thyroid unremarkable. Palpable laryngeal landmarks. Full neck range of motion   Neurologic:  CN II - XI intact. Normal gait      Assessment and Plan:   1. Chronic cough   - Has stopped ACE inh, takes zyrtec regularly, and has increased Pepcid to 40 mg BID   - Chest CT negative and PFT negative  - Will change to PPI + H2 blocker  - Will arrange GI referral for objective GERD evaluation  - Add flonase in case of allergy component  - Pending the above, if no changes, consider dx neurogenic larynx, and consider trial of gabapentin vs TCA       The patient was instructed to return to clinic if no improvement or progression of symptoms. Signs to watch out for reviewed.      , MD   [facility]  [address] [facility] [address]  [place], [place]  Office Phone: [phone]

## 2020-07-18 NOTE — Progress Notes (Signed)
 Chief Complaint   Patient presents with   . Follow-up   . Cough     Pt stated cough has been for a year now.        Visit Vitals  BP 136/78   Pulse 80   Resp 20   Ht 5' 6 (1.676 m)   Wt 192 lb 9.6 oz (87.4 kg)   SpO2 96%   BMI 31.09 kg/m

## 2020-07-19 NOTE — Telephone Encounter (Signed)
LVM asking pt to call the office regarding GI referral Dr. Lorinda Creed sent out.   Office notes and referral faxed to   Dr. Joanell Rising   Number: [phone]

## 2020-07-26 ENCOUNTER — Encounter

## 2020-07-27 MED ORDER — PREDNISONE 20 MG TAB
20 mg | ORAL_TABLET | Freq: Every day | ORAL | 0 refills | Status: AC
Start: 2020-07-27 — End: 2020-08-01

## 2020-07-27 MED ORDER — PROMETHAZINE-CODEINE 6.25 MG-10 MG/5 ML SYRUP
6.25-105 mg/5 mL | Freq: Four times a day (QID) | ORAL | 0 refills | Status: AC | PRN
Start: 2020-07-27 — End: 2020-08-01

## 2020-08-10 MED ORDER — FLUTICASONE 50 MCG/ACTUATION NASAL SPRAY, SUSP
50 mcg/actuation | NASAL | 2 refills | Status: AC
Start: 2020-08-10 — End: ?

## 2020-08-20 ENCOUNTER — Encounter

## 2020-08-22 MED ORDER — DILTIAZEM ER 240 MG 24 HR CAP
240 mg | ORAL_CAPSULE | ORAL | 1 refills | Status: AC
Start: 2020-08-22 — End: 2021-02-15

## 2020-09-07 ENCOUNTER — Ambulatory Visit: Admit: 2020-09-07 | Discharge: 2020-09-07 | Payer: BLUE CROSS/BLUE SHIELD | Attending: Otolaryngology | Primary: Family

## 2020-09-07 ENCOUNTER — Ambulatory Visit: Attending: Otolaryngology | Primary: Family

## 2020-09-07 DIAGNOSIS — R053 Chronic cough: Secondary | ICD-10-CM

## 2020-09-07 NOTE — Progress Notes (Signed)
Otolaryngology-Head and Neck Surgery  Follow Up Patient Visit     Patient: Travis Weaver  Date of Birth: [birth year]  MRN: [id number]  Date of Service:   09/07/2020    Chief Complaint: Chronic cough     Interval hx 09/07/2020  Cough better in the last few weeks  Was given prednisone and cough suppressant via mychart - prednisone did not help    Cough seems to come and go however - periods where it can be better and then gets worse again    Currently on Pepcid, Prilosec, flonase    History of Present Illness: NEEV MCMAINS is a 55 y.o. year old male who was last seen by Dr Sherlyn Lick for chronic cough a few months ago    Over the last 1 year, and developed chronic cough   Intermittent, largely dry  Very bothersome, worse at night     Has tried consistent zyrtec without relief  Has seen pulm - Dr Mitzi Hansen; had neg chest CT and pulm function testing   Was taken off lisinopril   Takes pepcid 20 mg at baseline; saw Dr Sherlyn Lick - increased to 40 mg BID, without change     Denies post nasal drip, allergies, GERD concerns       Past Medical History:  Past Medical History:   Diagnosis Date    Contact dermatitis and eczema due to cause     Hypertension        Past Surgical History:   No past surgical history on file.    Medications:   Current Outpatient Medications   Medication Instructions    benzonatate (TESSALON) 100 mg, Oral, 3 TIMES DAILY    clobetasoL (CLOBEX) 0.05 % lotion Topical, 2 TIMES DAILY    dilTIAZem ER (CARDIZEM CD) 240 mg capsule TAKE 1 CAPSULE BY MOUTH EVERY DAY    famotidine (PEPCID) 80 mg, Oral, DAILY    fluticasone propionate (FLONASE) 50 mcg/actuation nasal spray SPRAY 2 SPRAYS INTO EACH NOSTRIL EVERY DAY    hydrOXYzine pamoate (VISTARIL) 25 mg, Oral, 3 TIMES DAILY AS NEEDED    Linzess 290 mcg, Oral, DAILY    omeprazole (PRILOSEC) 40 mg, Oral, DAILY    Symbicort 160-4.5 mcg/actuation HFAA INHALE 2 PUFFS BY MOUTH EVERY AM AND 2 PUFFS EVERY PM.RINSE MOUTH AFTER EACH USE.       Allergies:   No Known Allergies    Social  History:   Social History     Tobacco Use    Smoking status: Former     Packs/day: 1.00     Years: 35.00     Pack years: 35.00     Types: Cigarettes     Quit date: 2016     Years since quitting: 6.6    Smokeless tobacco: Never    Tobacco comments:     Patient vapes   Vaping Use    Vaping Use: Every day    Substances: Nicotine    Devices: Refillable tank   Substance Use Topics    Alcohol use: Never    Drug use: Never       Family History:  Family History   Problem Relation Age of Onset    OSTEOARTHRITIS Mother     Cancer Mother     Elevated Lipids Mother     Heart Disease Mother     Hypertension Mother     OSTEOARTHRITIS Father     Cancer Father     Elevated Lipids Father     Hypertension  Father     Hypertension Sister     OSTEOARTHRITIS Brother        Review of Systems:  Consitutional: denies fever, excessive weight gain or loss.  Eyes: denies diplopia, eye pain.  Integumentary: denies new concerning skin lesions.  Ears, Nose, Mouth, Throat: denies except as per HPI.  Endocrine: denies hot or cold intolerance, increased thirst.  Respiratory: denies cough, hemoptysis, wheezing  Gastrointestinal: denies trouble swallowing, nausea, emesis, regurgitation  Musculoskeletal: denies muscle weakness or wasting  Cardiovascular: denies chest pain, shortness of breath  Neurologic: denies seizures, numbness or tingling, syncope  Hematologic: denies easy bleeding or bruising    Physical Examination:   Vitals:    09/07/20 0835   BP: 120/70   BP 1 Location: Left upper arm   BP Patient Position: Sitting   BP Cuff Size: Large adult   Pulse: 82   Resp: 16   Height: 5\' 7"  (1.702 m)   Weight: 192 lb (87.1 kg)   SpO2: 98%         General: Comfortable, pleasant, appears stated age  Voice: Strong, speaking in full sentences, no stridor. Intermittent harsh dry cough  Face: No masses or lesions, facial strength symmetric   Ears: External ears unremarkable. Bilateral ear canal clear. Tympanic membrane clear and intact, with visible landmarks.  Clear middle ear space  Nose: External nose unremarkable. Dorsum midline. Anterior rhinoscopy demonstrates no lesions. Septum midline. Turbinates without hypertrophy.  Oral Cavity / Oropharynx: No trismus. Mucosa pink and moist. No lesions. Tongue is midline and mobile. Palate elevates symmetrically. Uvula midline. Tonsils unremarkable. Base of tongue soft. Floor of mouth soft.   Neck: Supple. No adenopathy. Thyroid unremarkable. Palpable laryngeal landmarks. Full neck range of motion   Neurologic: CN II - XI intact. Normal gait    PROCEDURE: FLEXIBLE FIBEROPTIC LARYNGOSCOPY    Preoperative Diagnosis:  Chronic cough     Postoperative Diagnosis: Same     Procedure: Flexible Fiberoptic Laryngoscopy    Anesthesia: Topical 4% Lidocaine, Oxymetazoline    Description of Procedure: Verbal informed consent was obtained. After application of topical anesthetic and decongestant, the flexible fiberoptic endoscope was introduced to the patient's nare. It was passed through the nose and into the pharynx. The scope was then withdrawn and repeated on the opposing nare. The patient tolerated the procedure well.     Findings: Normal nasal cavity, no polyposis or purulence.  Middle meatus clear bilaterally. Nasopharynx without lesions. Base of tongue, vallecula & epiglottis unremarkable. Clear pyriform sinus. Vocal folds without lesions. Normal mobility. Visualized subglottis appears patent.  Erythema and edema of interarytenoid space consistent with LPR       Assessment and Plan:   Chronic cough   - Scope exam suggestive of LPR / GERD   - Has stopped ACE inh, has used zyrtec + flonase, and is on PPI + H2 blocker   - Chest CT negative and PFT negative  - Has GI referral for objective GERD evaluation - upcoming next week   - Can consider addition of singulair   - Pending the above, if no changes, consider dx neurogenic larynx, and consider trial of gabapentin vs TCA  - He will update me after GI visit     The patient was instructed to  return to clinic if no improvement or progression of symptoms. Signs to watch out for reviewed.      , MD   [facility]  [address] [place] [address]  Office Phone: [phone]

## 2020-09-07 NOTE — Progress Notes (Signed)
 Visit Vitals  BP 120/70 (BP 1 Location: Left upper arm, BP Patient Position: Sitting, BP Cuff Size: Large adult)   Pulse 82   Resp 16   Ht 5' 7 (1.702 m)   Wt 192 lb (87.1 kg)   SpO2 98%   BMI 30.07 kg/m     Chief Complaint   Patient presents with    Follow-up     Chronic cough

## 2020-09-08 MED ORDER — MONTELUKAST 10 MG TAB
10 mg | ORAL_TABLET | Freq: Every day | ORAL | 2 refills | Status: DC
Start: 2020-09-08 — End: 2020-12-08

## 2020-09-27 MED ORDER — OMEPRAZOLE 40 MG CAP, DELAYED RELEASE
40 mg | ORAL_CAPSULE | Freq: Two times a day (BID) | ORAL | 1 refills | Status: DC
Start: 2020-09-27 — End: 2020-10-21

## 2020-09-27 NOTE — Telephone Encounter (Signed)
I sent a prescription for you, lets talk soon

## 2020-10-09 ENCOUNTER — Encounter

## 2020-10-10 MED ORDER — HYDROXYZINE PAMOATE 25 MG CAP
25 mg | ORAL_CAPSULE | Freq: Three times a day (TID) | ORAL | 1 refills | Status: AC | PRN
Start: 2020-10-10 — End: ?

## 2020-10-13 MED ORDER — OMEPRAZOLE 40 MG CAP, DELAYED RELEASE
40 mg | ORAL_CAPSULE | ORAL | 1 refills | Status: DC
Start: 2020-10-13 — End: 2021-01-26

## 2020-10-21 ENCOUNTER — Encounter

## 2020-10-21 MED ORDER — OMEPRAZOLE 40 MG CAP, DELAYED RELEASE
40 mg | ORAL_CAPSULE | Freq: Two times a day (BID) | ORAL | 1 refills | Status: DC
Start: 2020-10-21 — End: 2020-11-17

## 2020-10-27 ENCOUNTER — Encounter

## 2020-10-27 MED ORDER — LINZESS 290 MCG CAPSULE
290 mcg | ORAL_CAPSULE | ORAL | 1 refills | Status: AC
Start: 2020-10-27 — End: ?

## 2020-10-27 MED ORDER — PROMETHAZINE-CODEINE 6.25 MG-10 MG/5 ML SYRUP
Freq: Four times a day (QID) | ORAL | 0 refills | Status: AC | PRN
Start: 2020-10-27 — End: 2020-11-01

## 2020-11-14 NOTE — Telephone Encounter (Signed)
Attempted to reach Travis Weaver to let him know that his appointment scheduled on 11/14 needs to be rescheduled due to Dr. Lorinda Creed being out of the office and left a voicemail stating the appointment has been cancelled and asked that the patient to call back to reschedule.

## 2020-11-17 MED ORDER — OMEPRAZOLE 40 MG CAP, DELAYED RELEASE
40 mg | ORAL_CAPSULE | Freq: Two times a day (BID) | ORAL | 1 refills | Status: AC
Start: 2020-11-17 — End: 2021-01-16

## 2020-12-08 MED ORDER — MONTELUKAST 10 MG TAB
10 mg | ORAL_TABLET | Freq: Every day | ORAL | 1 refills | Status: AC
Start: 2020-12-08 — End: 2021-01-07

## 2020-12-12 ENCOUNTER — Encounter: Attending: Otolaryngology | Primary: Family

## 2021-01-25 NOTE — Telephone Encounter (Signed)
LVM to confirm appt scheduled 12/29

## 2021-01-26 ENCOUNTER — Ambulatory Visit: Admit: 2021-01-26 | Discharge: 2021-01-26 | Payer: BLUE CROSS/BLUE SHIELD | Attending: Otolaryngology | Primary: Family

## 2021-01-26 DIAGNOSIS — R053 Chronic cough: Secondary | ICD-10-CM

## 2021-01-26 MED ORDER — OMEPRAZOLE 40 MG CAP, DELAYED RELEASE
40 mg | ORAL_CAPSULE | Freq: Every day | ORAL | 1 refills | Status: DC
Start: 2021-01-26 — End: 2021-03-13

## 2021-01-26 NOTE — Progress Notes (Signed)
Otolaryngology-Head and Neck Surgery  Follow Up Patient Visit     Patient: Travis Weaver  Date of Birth: [birth year]  MRN: [id number]  Date of Service:   01/26/2021      Chief Complaint: Chronic cough     Interval hx 01/26/2021  Doing better for the last month   On singulair and BID 40 mg prilosec     Interval hx 09/07/2020  Cough better in the last few weeks  Was given prednisone and cough suppressant via mychart - prednisone did not help    Cough seems to come and go however - periods where it can be better and then gets worse again    Currently on Pepcid, Prilosec, flonase    History of Present Illness: Travis Weaver is a 55 y.o. year old male who was last seen by Dr Sherlyn Lick for chronic cough a few months ago    Over the last 1 year, and developed chronic cough   Intermittent, largely dry  Very bothersome, worse at night     Has tried consistent zyrtec without relief  Has seen pulm - Dr Mitzi Hansen; had neg chest CT and pulm function testing   Was taken off lisinopril   Takes pepcid 20 mg at baseline; saw Dr Sherlyn Lick - increased to 40 mg BID, without change     Denies post nasal drip, allergies, GERD concerns       Past Medical History:  Past Medical History:   Diagnosis Date    Contact dermatitis and eczema due to cause     Hypertension        Past Surgical History:   History reviewed. No pertinent surgical history.    Medications:   Current Outpatient Medications   Medication Instructions    benzonatate (TESSALON) 100 mg, Oral, 3 TIMES DAILY    clobetasoL (CLOBEX) 0.05 % lotion Topical, 2 TIMES DAILY    dilTIAZem ER (CARDIZEM CD) 240 mg capsule TAKE 1 CAPSULE BY MOUTH EVERY DAY    famotidine (PEPCID) 80 mg, Oral, DAILY    fluticasone propionate (FLONASE) 50 mcg/actuation nasal spray SPRAY 2 SPRAYS INTO EACH NOSTRIL EVERY DAY    hydrOXYzine pamoate (VISTARIL) 25 mg, Oral, 3 TIMES DAILY AS NEEDED    Linzess 290 mcg cap capsule TAKE 1 CAPSULE BY MOUTH EVERY DAY    omeprazole (PRILOSEC) 40 mg capsule TAKE 1 CAPSULE BY MOUTH  EVERY DAY    Symbicort 160-4.5 mcg/actuation HFAA INHALE 2 PUFFS BY MOUTH EVERY AM AND 2 PUFFS EVERY PM.RINSE MOUTH AFTER EACH USE.       Allergies:   No Known Allergies    Social History:   Social History     Tobacco Use    Smoking status: Former     Packs/day: 1.00     Years: 35.00     Pack years: 35.00     Types: Cigarettes     Quit date: 2016     Years since quitting: 6.9    Smokeless tobacco: Never    Tobacco comments:     Patient vapes   Vaping Use    Vaping Use: Every day    Substances: Nicotine    Devices: Refillable tank   Substance Use Topics    Alcohol use: Never    Drug use: Never       Family History:  Family History   Problem Relation Age of Onset    OSTEOARTHRITIS Mother     Cancer Mother     Elevated Lipids  Mother     Heart Disease Mother     Hypertension Mother     OSTEOARTHRITIS Father     Cancer Father     Elevated Lipids Father     Hypertension Father     Hypertension Sister     OSTEOARTHRITIS Brother        Review of Systems:  Consitutional: denies fever, excessive weight gain or loss.  Eyes: denies diplopia, eye pain.  Integumentary: denies new concerning skin lesions.  Ears, Nose, Mouth, Throat: denies except as per HPI.  Endocrine: denies hot or cold intolerance, increased thirst.  Respiratory: denies cough, hemoptysis, wheezing  Gastrointestinal: denies trouble swallowing, nausea, emesis, regurgitation  Musculoskeletal: denies muscle weakness or wasting  Cardiovascular: denies chest pain, shortness of breath  Neurologic: denies seizures, numbness or tingling, syncope  Hematologic: denies easy bleeding or bruising    Physical Examination:   Vitals:    01/26/21 0952   BP: 120/80   BP 1 Location: Left upper arm   BP Patient Position: Sitting   BP Cuff Size: Adult   Pulse: 62   Resp: 18   Height: 5\' 7"  (1.702 m)   Weight: 192 lb (87.1 kg)   SpO2: 98%         General: Comfortable, pleasant, appears stated age  Voice: Strong, speaking in full sentences, no stridor. Occ throat clear but no cough  during encounter today  Face: No masses or lesions, facial strength symmetric   Ears: External ears unremarkable. Bilateral ear canal clear. Tympanic membrane clear and intact, with visible landmarks. Clear middle ear space  Nose: External nose unremarkable. Dorsum midline. Anterior rhinoscopy demonstrates no lesions. Septum midline. Turbinates without hypertrophy.  Oral Cavity / Oropharynx: No trismus. Mucosa pink and moist. No lesions. Tongue is midline and mobile. Palate elevates symmetrically. Uvula midline. Tonsils unremarkable. Base of tongue soft. Floor of mouth soft.   Neck: Supple. No adenopathy. Thyroid unremarkable. Palpable laryngeal landmarks. Full neck range of motion   Neurologic: CN II - XI intact. Normal gait      Assessment and Plan:   Chronic cough   - Scope exam suggestive of LPR / GERD   - Has stopped ACE inh, has used zyrtec + flonase  - Chest CT negative and PFT negative  - GI endoscopy was negative - has not had objective reflux testing  - He is now on BID prilosec 40 mg which he finds to be helpful  - I am cautiously optimistic  - Will continue BID for another month  - Wean to once daily for a couple of months   - Can then work to taper  - If cough seems to come back, despite continued PPI, then consider neuromodulators  - If cough recurs with reduction / cessation of PPI, then consider GI re-evaluation for additional discussion     The patient was instructed to return to clinic if no improvement or progression of symptoms. Signs to watch out for reviewed.      , MD   [facility] [facility]  [address] [place] [address]  [place], [place]  Office Phone: [phone]

## 2021-02-07 ENCOUNTER — Encounter

## 2021-02-08 MED ORDER — GABAPENTIN 100 MG CAP
100 mg | ORAL_CAPSULE | ORAL | 0 refills | Status: AC
Start: 2021-02-08 — End: ?

## 2021-02-15 ENCOUNTER — Encounter

## 2021-02-15 MED ORDER — DILTIAZEM ER 240 MG 24 HR CAP
240 mg | ORAL_CAPSULE | ORAL | 0 refills | Status: AC
Start: 2021-02-15 — End: 2021-05-23

## 2021-03-01 MED ORDER — GABAPENTIN 300 MG CAP
300 mg | ORAL_CAPSULE | Freq: Three times a day (TID) | ORAL | 2 refills | Status: AC
Start: 2021-03-01 — End: 2021-03-31

## 2021-03-01 MED ORDER — ALBUTEROL SULFATE HFA 90 MCG/ACTUATION AEROSOL INHALER
90 mcg/actuation | Freq: Four times a day (QID) | RESPIRATORY_TRACT | 1 refills | Status: AC | PRN
Start: 2021-03-01 — End: ?

## 2021-03-01 NOTE — Addendum Note (Signed)
Addendum  Note by Llana Aliment, MD at 03/01/21 1725                Author: Llana Aliment, MD  Service: --  Author Type: Physician       Filed: 03/01/21 1725  Encounter Date: 02/07/2021  Status: Signed          Editor: Llana Aliment, MD (Physician)          Addended by: Llana Aliment on: 03/01/2021 05:25 PM    Modules accepted: Orders

## 2021-03-13 MED ORDER — OMEPRAZOLE 40 MG CAP, DELAYED RELEASE
40 mg | ORAL_CAPSULE | Freq: Every day | ORAL | 1 refills | Status: AC
Start: 2021-03-13 — End: ?

## 2021-03-13 NOTE — Telephone Encounter (Signed)
Telephone Encounter by Llana Aliment, MD at 03/13/21 1401                Author: Llana Aliment, MD  Service: --  Author Type: Physician       Filed: 03/13/21 1401  Encounter Date: 02/07/2021  Status: Signed          Editor: Llana Aliment, MD (Physician)          Ardell Isaacs, LPN 0/[id number]/[id number]  0:88 AM EST----- Message -----From: [place] Denver VernonSent: 03/12/2021   7:14 PM ESTTo: Rep NurseSubject: Coughing.                                     Hello,Do you still want me to take 2 OMEPRAZOLE daily. If so I need a new prescription. The old prescription was 1 a day and I was taking 2. I will run out of everything Friday.

## 2021-03-13 NOTE — Addendum Note (Signed)
Addendum  Note by Llana Aliment, MD at 03/13/21 1401                Author: Llana Aliment, MD  Service: --  Author Type: Physician       Filed: 03/13/21 1401  Encounter Date: 02/07/2021  Status: Signed          Editor: Llana Aliment, MD (Physician)          Addended by: Llana Aliment on: 03/13/2021 02:01 PM    Modules accepted: Orders

## 2021-04-25 ENCOUNTER — Ambulatory Visit: Admit: 2021-04-25 | Discharge: 2021-04-25 | Payer: BLUE CROSS/BLUE SHIELD | Attending: Otolaryngology | Primary: Family

## 2021-04-25 DIAGNOSIS — R053 Chronic cough: Secondary | ICD-10-CM

## 2021-04-25 NOTE — Progress Notes (Signed)
 Chief Complaint   Patient presents with    Follow-up       Visit Vitals  BP 120/80 (BP 1 Location: Left upper arm, BP Patient Position: Sitting, BP Cuff Size: Adult)   Pulse 72   Resp 18   Ht 5' 7 (1.702 m)   Wt 192 lb (87.1 kg)   SpO2 98%   BMI 30.07 kg/m

## 2021-04-26 MED ORDER — OMEPRAZOLE 20 MG CAP, DELAYED RELEASE
20 mg | ORAL_CAPSULE | Freq: Every day | ORAL | 1 refills | Status: AC
Start: 2021-04-26 — End: ?

## 2021-05-23 MED ORDER — DILTIAZEM ER 240 MG 24 HR CAP
240 mg | ORAL_CAPSULE | ORAL | 0 refills | Status: AC
Start: 2021-05-23 — End: ?

## 2021-06-16 ENCOUNTER — Encounter

## 2021-06-16 MED ORDER — GABAPENTIN 300 MG PO CAPS
300 MG | ORAL_CAPSULE | Freq: Three times a day (TID) | ORAL | 1 refills | Status: AC
Start: 2021-06-16 — End: 2021-07-16

## 2021-07-02 ENCOUNTER — Encounter

## 2021-07-03 MED ORDER — LINZESS 290 MCG PO CAPS
290 MCG | ORAL_CAPSULE | ORAL | 1 refills | Status: DC
Start: 2021-07-03 — End: 2022-01-15

## 2021-07-25 ENCOUNTER — Encounter: Attending: Otolaryngology | Primary: Family

## 2021-07-25 ENCOUNTER — Ambulatory Visit: Admit: 2021-07-25 | Discharge: 2021-07-25 | Payer: BLUE CROSS/BLUE SHIELD | Attending: Otolaryngology | Primary: Family

## 2021-07-25 DIAGNOSIS — R053 Chronic cough: Secondary | ICD-10-CM

## 2021-07-25 MED ORDER — PROMETHAZINE-CODEINE 6.25-10 MG/5ML PO SYRP
Freq: Four times a day (QID) | ORAL | 0 refills | Status: AC | PRN
Start: 2021-07-25 — End: 2021-08-01

## 2021-07-25 MED ORDER — PREDNISONE 20 MG PO TABS
20 MG | ORAL_TABLET | Freq: Every day | ORAL | 0 refills | Status: AC
Start: 2021-07-25 — End: 2021-07-30

## 2021-07-25 MED ORDER — GABAPENTIN 300 MG PO CAPS
300 MG | ORAL_CAPSULE | Freq: Three times a day (TID) | ORAL | 1 refills | Status: AC
Start: 2021-07-25 — End: 2021-11-22

## 2021-07-25 NOTE — Progress Notes (Incomplete)
Otolaryngology-Head and Neck Surgery  Follow Up Patient Visit     Patient: Travis Weaver  Date of Birth: 1965/12/06  MRN: [id number]  Date of Service: 04/25/2021      Chief Complaint: Chronic cough     Interval hx 04/25/2021  Cough doing better  Weaned prilosec to 40 mg daily  On gabapentin 300 mg TID     Interval hx 01/26/2021  Doing better for the last month   On singulair and BID 40 mg prilosec     Interval hx 09/07/2020  Cough better in the last few weeks  Was given prednisone and cough suppressant via mychart - prednisone did not help    Cough seems to come and go however - periods where it can be better and then gets worse again    Currently on Pepcid, Prilosec, flonase    History of Present Illness: Travis Weaver is a 56 y.o. year old male who was last seen by Dr Travis Weaver for chronic cough a few months ago    Over the last 1 year, and developed chronic cough   Intermittent, largely dry  Very bothersome, worse at night     Has tried consistent zyrtec without relief  Has seen pulm - Dr Travis Weaver; had neg chest CT and pulm function testing   Was taken off lisinopril   Takes pepcid 20 mg at baseline; saw Dr Travis Weaver - increased to 40 mg BID, without change     Denies post nasal drip, allergies, GERD concerns       Past Medical History:  Past Medical History:   Diagnosis Date    Contact dermatitis and eczema due to cause     Hypertension        Past Surgical History:   History reviewed. No pertinent surgical history.    Medications:   Current Outpatient Medications   Medication Instructions    albuterol (PROVENTIL HFA, VENTOLIN HFA, PROAIR HFA) 90 mcg/actuation inhaler 1 Puff, Inhalation, EVERY 6 HOURS AS NEEDED    benzonatate (TESSALON) 100 mg, Oral, 3 TIMES DAILY    dilTIAZem ER (CARDIZEM CD) 240 mg capsule TAKE 1 CAPSULE BY MOUTH EVERY DAY    fluticasone propionate (FLONASE) 50 mcg/actuation nasal spray SPRAY 2 SPRAYS INTO EACH NOSTRIL EVERY DAY    gabapentin (NEURONTIN) 100 mg capsule WEEK 1: 100 mg TID x 7 days. WEEK 2:  100 mg AM, afternoon, 200 mg PM. WEEK 3: 100 mg AM, 200 mg afternoon, 200 mg PM.  WEEK 4. 200 mg TID.    hydrOXYzine pamoate (VISTARIL) 25 mg, Oral, 3 TIMES DAILY AS NEEDED    Linzess 290 mcg cap capsule TAKE 1 CAPSULE BY MOUTH EVERY DAY    omeprazole (PRILOSEC) 40 mg, Oral, DAILY       Allergies:   No Known Allergies    Social History:   Social History     Tobacco Use    Smoking status: Former     Packs/day: 1.00     Years: 35.00     Pack years: 35.00     Types: Cigarettes     Quit date: 2016     Years since quitting: 7.2    Smokeless tobacco: Never    Tobacco comments:     Patient vapes   Vaping Use    Vaping Use: Every day    Substances: Nicotine    Devices: Refillable tank   Substance Use Topics    Alcohol use: Never    Drug use: Never  Family History:  Family History   Problem Relation Age of Onset    OSTEOARTHRITIS Mother     Cancer Mother     Elevated Lipids Mother     Heart Disease Mother     Hypertension Mother     OSTEOARTHRITIS Father     Cancer Father     Elevated Lipids Father     Hypertension Father     Hypertension Sister     OSTEOARTHRITIS Brother        Review of Systems:  Consitutional: denies fever, excessive weight gain or loss.  Eyes: denies diplopia, eye pain.  Integumentary: denies new concerning skin lesions.  Ears, Nose, Mouth, Throat: denies except as per HPI.  Endocrine: denies hot or cold intolerance, increased thirst.  Respiratory: denies cough, hemoptysis, wheezing  Gastrointestinal: denies trouble swallowing, nausea, emesis, regurgitation  Musculoskeletal: denies muscle weakness or wasting  Cardiovascular: denies chest pain, shortness of breath  Neurologic: denies seizures, numbness or tingling, syncope  Hematologic: denies easy bleeding or bruising    Physical Examination:   Vitals:    04/25/21 1542   BP: 120/80   BP 1 Location: Left upper arm   BP Patient Position: Sitting   BP Cuff Size: Adult   Pulse: 72   Resp: 18   Height: 5\' 7"  (1.702 m)   Weight: 192 lb (87.1 kg)   SpO2:  98%         General: Comfortable, pleasant, appears stated age  Voice: Strong, speaking in full sentences, no stridor. Occ throat clear but no cough during encounter today  Face: No masses or lesions, facial strength symmetric   Ears: External ears unremarkable. Bilateral ear canal clear. Tympanic membrane clear and intact, with visible landmarks. Clear middle ear space  Nose: External nose unremarkable. Dorsum midline. Anterior rhinoscopy demonstrates no lesions. Septum midline. Turbinates without hypertrophy.  Oral Cavity / Oropharynx: No trismus. Mucosa pink and moist. No lesions. Tongue is midline and mobile. Palate elevates symmetrically. Uvula midline. Tonsils unremarkable. Base of tongue soft. Floor of mouth soft.   Neck: Supple. No adenopathy. Thyroid unremarkable. Palpable laryngeal landmarks. Full neck range of motion   Neurologic: CN II - XI intact. Normal gait      Assessment and Plan:   Chronic cough   - Scope exam suggestive of LPR / GERD   - Has stopped ACE inh, has used zyrtec + flonase  - Chest CT negative and PFT negative  - GI endoscopy was negative - has not had objective reflux testing  - Has weaned from 40 mg BID prilosec to 40 mg daily. Will wean to 20 mg tabs now  - Continue gabapentin 300 mg TID   - Follow up 3-4 months      The patient was instructed to return to clinic if no improvement or progression of symptoms. Signs to watch out for reviewed.      Travis Aliment, MD   [facility]  [address] [facility] [address]  [place], [place] [address]  Office Phone: [phone]

## 2021-07-25 NOTE — Progress Notes (Unsigned)
Chief Complaint   Patient presents with    Follow-up     BP 122/80   Pulse 81   Resp 16   Ht 5\' 7"  (1.702 m)   Wt 198 lb 12.8 oz (90.2 kg)   SpO2 96%   BMI 31.14 kg/m   1. Have you been to the ER, urgent care clinic since your last visit?  Hospitalized since your last visit?No    2. Have you seen or consulted any other health care providers outside of the [facility] since your last visit?  Include any pap smears or colon screening. No

## 2021-07-26 ENCOUNTER — Encounter

## 2021-07-28 MED ORDER — GUAIFENESIN-CODEINE 100-10 MG/5ML PO SOLN
100-10 MG/5ML | Freq: Three times a day (TID) | ORAL | 0 refills | Status: AC | PRN
Start: 2021-07-28 — End: 2021-08-04

## 2021-08-28 ENCOUNTER — Encounter

## 2021-08-28 MED ORDER — DILTIAZEM HCL ER COATED BEADS 240 MG PO CP24
240 MG | ORAL_CAPSULE | ORAL | 0 refills | Status: DC
Start: 2021-08-28 — End: 2022-01-08

## 2021-09-14 ENCOUNTER — Telehealth: Admit: 2021-09-14 | Discharge: 2021-09-14 | Payer: BLUE CROSS/BLUE SHIELD | Attending: Family | Primary: Family

## 2021-09-14 DIAGNOSIS — Z716 Tobacco abuse counseling: Secondary | ICD-10-CM

## 2021-09-14 MED ORDER — BUPROPION HCL ER (XL) 150 MG PO TB24
150 MG | ORAL_TABLET | Freq: Every morning | ORAL | 1 refills | Status: AC
Start: 2021-09-14 — End: ?

## 2021-09-14 NOTE — Progress Notes (Signed)
HISTORY OF PRESENT ILLNESS  Travis Weaver is a 56 y.o. male presents via telemedicine for vaping cessation.    Patient reported that he was using tobacco products and has switched to vaping however he is not able to quit vape.  He is vaping nicotine.  Patient reported he has tried to quit vaping however he had worsening anxiety and has started snacking more.  Has high stress at work , vaping nicotine helps to reduce his anxiety/stress levels.    He has tried patches in the past but these were not helpful.     There were no vitals filed for this visit.  Patient Active Problem List   Diagnosis    Constipation    Chronic bronchitis with acute exacerbation (HCC)    Degenerative disc disease, lumbar    Essential hypertension    Ventricular premature complex    Eczema    IBS (irritable bowel syndrome)     Patient Active Problem List    Diagnosis Date Noted    Chronic bronchitis with acute exacerbation (HCC) 11/05/2019    Degenerative disc disease, lumbar 04/21/2019    Constipation 10/08/2018    Essential hypertension 10/08/2018    Ventricular premature complex 10/08/2018    Eczema 10/08/2018    IBS (irritable bowel syndrome) 10/08/2018     Current Outpatient Medications   Medication Sig Dispense Refill    buPROPion (WELLBUTRIN XL) 150 MG extended release tablet Take 1 tablet by mouth every morning 30 tablet 1    dilTIAZem (CARDIZEM CD) 240 MG extended release capsule TAKE 1 CAPSULE BY MOUTH EVERY DAY 90 capsule 0    gabapentin (NEURONTIN) 300 MG capsule Take 1 capsule by mouth 3 times daily for 120 days. Max Daily Amount: 900 mg 180 capsule 1    benzonatate (TESSALON) 100 MG capsule Take 1 capsule by mouth 3 times daily      dilTIAZem (TIAZAC) 240 MG extended release capsule Take 1 tablet by mouth daily      omeprazole (PRILOSEC) 20 MG delayed release capsule Take 1 capsule by mouth daily      LINZESS 290 MCG CAPS capsule TAKE 1 CAPSULE BY MOUTH EVERY DAY 90 capsule 1     No current facility-administered medications for  this visit.     No Known Allergies  Past Medical History:   Diagnosis Date    Contact dermatitis and eczema due to cause     Hypertension     Irritable bowel syndrome      History reviewed. No pertinent surgical history.  Family History   Problem Relation Age of Onset    Hypertension Father     Elevated Lipids Father     Cancer Father     Osteoarthritis Father     Arthritis Father     Hypertension Mother     Heart Disease Mother     Elevated Lipids Mother     Cancer Mother     Osteoarthritis Mother     Arthritis Mother     Osteoarthritis Brother     Hypertension Sister      Social History     Tobacco Use    Smoking status: Every Day     Types: E-Cigarettes    Smokeless tobacco: Never   Substance Use Topics    Alcohol use: Yes     Alcohol/week: 4.0 standard drinks     Types: 4 Shots of liquor per week           Review of  Systems   Constitutional:  Negative for fatigue.   Psychiatric/Behavioral:  The patient is nervous/anxious.        Physical Exam  Constitutional:       Appearance: Normal appearance.   HENT:      Head: Normocephalic.   Eyes:      Conjunctiva/sclera: Conjunctivae normal.   Pulmonary:      Effort: Pulmonary effort is normal.   Musculoskeletal:      Cervical back: Normal range of motion.   Skin:     Coloration: Skin is not pale.   Neurological:      Mental Status: He is alert and oriented to person, place, and time.   Psychiatric:         Mood and Affect: Mood normal.         Behavior: Behavior normal.          ASSESSMENT and PLAN  Travis Weaver was seen today for follow-up.    Diagnoses and all orders for this visit:    Encounter for tobacco use cessation counseling  Comments:  start on wellbutrin, will help with stress/snack cravings as well.   discussed picking a quit date in the next few weeks.  Orders:  -     buPROPion (WELLBUTRIN XL) 150 MG extended release tablet; Take 1 tablet by mouth every morning    Anxiety  Comments:  wellbutrin may help reduce some anxiety burden associated with work.  Orders:  -      buPROPion (WELLBUTRIN XL) 150 MG extended release tablet; Take 1 tablet by mouth every morning         Pilar Jarvis, was evaluated through a synchronous (real-time) audio-video encounter. The patient (or guardian if applicable) is aware that this is a billable service, which includes applicable co-pays. This Virtual Visit was conducted with patient's (and/or legal guardian's) consent. Patient identification was verified, and a caregiver was present when appropriate.   The patient was located at Other: at work  Provider was located at   Home [place]       Travis Weaver is a 56 y.o. male being evaluated by a Virtual Visit (video visit) encounter to address concerns as mentioned above.  A caregiver was present when appropriate. 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  Pursuant to the emergency declaration under the [facility] Act and the [employer], 1135 waiver authority and the [employer] and [employer] Act, this Virtual Visit was conducted with patient's (and/or legal guardian's) consent, to reduce the risk of exposure to COVID-19 and provide necessary medical care.      Services were provided through a video synchronous discussion virtually to substitute for in-person encounter.    --Court Joy, APRN - NP on 09/14/2021 at 7:43 AM    An electronic signature was used to authenticate this note.

## 2021-09-14 NOTE — Progress Notes (Signed)
Identified Patient with two Patient identifiers(name and DOB).     1. Have you been to the ER, urgent care clinic since your last visit?  Hospitalized since your last visit?No    2. Have you seen or consulted any other health care providers outside of the [facility] since your last visit?   No     3. For patients aged 58-75: Has the patient had a colonoscopy / FIT/ Cologuard? No    If the patient is male:    4. For patients aged 71-74: Has the patient had a mammogram within the past 2 years?  No      5. For patients aged 21-65: Has the patient had a pap smear?  No   There were no vitals taken for this visit.    Chief Complaint   Patient presents with    Follow-up     Stopped smoking       Health Maintenance Due   Topic Date Due    Pneumococcal 0-64 years Vaccine (1 - PCV) Never done    HIV screen  Never done    Hepatitis C screen  Never done    DTaP/Tdap/Td vaccine (1 - Tdap) Never done    Shingles vaccine (2 of 3) 08/02/2020    Low dose CT lung screening &/or counseling  11/10/2020    Flu vaccine (1) 08/29/2021      [facility] Mychart Vitals Questionnaire       Question 09/14/2021  7:13 AM EDT - Ceasar Mons by Patient    What is your height? 5'7"    What is your weight in pounds? 197    What is your top number blood pressure reading?       What is your bottom number blood pressure reading?       What is your pulse?       What is your temperature in Fahrenheit?        If you have a pulse oximeter, enter the reading here:       Patients with chronic lung disease who have a Peak Flow meter, please enter the reading here:             [facility] Mychart Social Determinants Of Health (Sdoh) Screening Questionnaire       Question 09/14/2021  7:14 AM EDT - Ceasar Mons by Patient    How hard is it for you to pay for the very basics like food, housing, medical care, and heating? Not hard at all    Within the past 12 months, you worried that your food would run out before you got the money to buy more. Never true    Within the past 12  months, the food you bought just didn't last and you didn't have money to get more. Never true    In the past 12 months, has lack of transportation kept you from meetings, work, or from getting things needed for daily living? No    In the last 12 months, was there a time when you did not have a steady place to sleep or slept in a shelter (including now)? No    Would you like resources for any of these topics? No, thank you

## 2021-09-21 ENCOUNTER — Encounter

## 2021-09-21 MED ORDER — DESONIDE 0.05 % EX CREA
0.05 % | CUTANEOUS | 1 refills | Status: AC
Start: 2021-09-21 — End: ?

## 2021-09-21 NOTE — Telephone Encounter (Signed)
From: Pilar Jarvis  To: Court Joy  Sent: 09/21/2021 4:28 AM EDT  Subject: Medication    Hello Arta Silence,  I am trying to get a refill for Desonide Cream 0.05%. I can not find it my list at [facility] or your office. It has been awhile since I got it filled.     Thank you  Tawanna Cooler

## 2021-10-23 MED ORDER — OMEPRAZOLE 20 MG PO CPDR
20 MG | ORAL_CAPSULE | Freq: Every day | ORAL | 1 refills | Status: DC
Start: 2021-10-23 — End: 2022-03-08

## 2021-10-24 ENCOUNTER — Encounter

## 2021-10-24 MED ORDER — VARENICLINE TARTRATE 0.5 MG PO TABS
0.5 MG | ORAL_TABLET | ORAL | 0 refills | Status: DC
Start: 2021-10-24 — End: 2022-03-08

## 2021-10-24 NOTE — Telephone Encounter (Signed)
From: Jerrye Beavers  To: Elyse Hsu  Sent: 10/24/2021 9:32 AM EDT  Subject: Medication    Hi [name],  I just started the 2nd month of the new medication. I can not tell a difference in how bad I get stressed. I cut way back on my vaping and then quit for 2 days. Kim said I had to go to the store and get more. LOL. Are we sure I don't need something stronger and also a nicotine blocker.

## 2021-11-08 ENCOUNTER — Encounter

## 2021-11-08 MED ORDER — BUPROPION HCL ER (XL) 150 MG PO TB24
150 MG | ORAL_TABLET | Freq: Every morning | ORAL | 1 refills | Status: DC
Start: 2021-11-08 — End: 2022-03-08

## 2021-12-23 ENCOUNTER — Encounter

## 2021-12-27 ENCOUNTER — Encounter

## 2021-12-27 MED ORDER — GABAPENTIN 300 MG PO CAPS
300 MG | ORAL_CAPSULE | Freq: Three times a day (TID) | ORAL | 3 refills | Status: AC
Start: 2021-12-27 — End: 2022-03-27

## 2022-01-08 ENCOUNTER — Encounter

## 2022-01-08 MED ORDER — DILTIAZEM HCL ER COATED BEADS 240 MG PO CP24
240 MG | ORAL_CAPSULE | ORAL | 0 refills | Status: DC
Start: 2022-01-08 — End: 2022-03-08

## 2022-01-13 ENCOUNTER — Encounter

## 2022-01-15 MED ORDER — LINZESS 290 MCG PO CAPS
290 MCG | ORAL_CAPSULE | ORAL | 1 refills | Status: DC
Start: 2022-01-15 — End: 2022-03-08

## 2022-01-31 NOTE — Telephone Encounter (Signed)
Formatting of this note might be different from the original.  Lvm for lab results  Electronically signed by Renie Ora, PA-C at 01/31/2022  9:06 AM EST

## 2022-03-08 ENCOUNTER — Encounter: Admit: 2022-03-08 | Discharge: 2022-03-08 | Payer: BLUE CROSS/BLUE SHIELD | Attending: Family | Primary: Family

## 2022-03-08 DIAGNOSIS — F419 Anxiety disorder, unspecified: Secondary | ICD-10-CM

## 2022-03-08 MED ORDER — LINZESS 290 MCG PO CAPS
290 | ORAL_CAPSULE | ORAL | 1 refills | Status: AC
Start: 2022-03-08 — End: ?

## 2022-03-08 MED ORDER — DILTIAZEM HCL ER COATED BEADS 240 MG PO CP24
240 | ORAL_CAPSULE | Freq: Every day | ORAL | 3 refills | Status: AC
Start: 2022-03-08 — End: ?

## 2022-03-08 MED ORDER — SERTRALINE HCL 25 MG PO TABS
25 MG | ORAL_TABLET | Freq: Every day | ORAL | 1 refills | Status: AC
Start: 2022-03-08 — End: 2022-04-05

## 2022-03-08 NOTE — Progress Notes (Signed)
Chief Complaint   Patient presents with    Follow-up     labs     "Have you been to the ER, urgent care clinic since your last visit?  Hospitalized since your last visit?"    NO    "Have you seen or consulted any other health care providers outside of [facility] since your last visit?"    NO

## 2022-03-08 NOTE — Progress Notes (Signed)
Travis Weaver is a 57 y.o. male who was seen via telemedicine today 03/08/2022).    Assessment & Plan:   Below is the assessment and plan developed based on review of pertinent history, physical exam, labs, studies, and medications.    1. Anxiety  Comments:  will start on zoloft for anxiety/grief  Orders:  -     sertraline (ZOLOFT) 25 MG tablet; Take 1 tablet by mouth daily, Disp-30 tablet, R-1Normal  2. Essential (primary) hypertension  Comments:  stable on current medication.   due for fasting labs  Orders:  -     dilTIAZem (CARDIZEM CD) 240 MG extended release capsule; Take 1 capsule by mouth daily, Disp-90 capsule, R-3Normal  -     CBC  -     Comprehensive Metabolic Panel  -     Lipid Panel  3. Irritable bowel syndrome with constipation  Comments:  uses linzess with good control  Orders:  -     LINZESS 290 MCG CAPS capsule; TAKE 1 CAPSULE BY MOUTH EVERY DAY, Disp-90 capsule, R-1, DAWNormal  4. Need for hepatitis C screening test  -     Hepatitis C Antibody  5. Screening for prostate cancer  -     PSA Screening      No follow-ups on file.    Subjective:   Travis Weaver was seen today for Follow-up (labs)     Hypertension: Patients BP medications were changed by pulmonology to diltiazem.  Denies headaches, blurred vision or dizziness.       Chronic cough:  Patient reported he is now seeing ENT, Dr. Noland Fordyce.  Is on gabapentin which has helped with chronic cough.  Also on famotidine.      IBS-C:  Well controlled on linzess.   Patient has been taking fiber supplement.  Does not enjoy vegetables.      Patient reported his dog recently passed away and he has been stressed/sad about this.   Patient reported that he has been stress eating and has gained significant weight.  Patient reported that he quit vaping and his weight has increased.        Review of Systems   Constitutional:  Negative for fatigue.   Eyes:  Negative for visual disturbance.   Respiratory:  Negative for shortness of breath.    Cardiovascular:  Negative for  chest pain, palpitations and leg swelling.   Neurological:  Negative for dizziness, weakness and headaches.   Psychiatric/Behavioral:  Positive for dysphoric mood. Negative for sleep disturbance. The patient is nervous/anxious.           Objective:     There were no vitals filed for this visit.   There is no height or weight on file to calculate BMI.     Physical Exam  Constitutional:       Appearance: Normal appearance.   HENT:      Head: Normocephalic.   Eyes:      Conjunctiva/sclera: Conjunctivae normal.   Pulmonary:      Effort: Pulmonary effort is normal.   Skin:     Coloration: Skin is not pale.   Neurological:      Mental Status: He is alert and oriented to person, place, and time.   Psychiatric:         Mood and Affect: Mood is anxious and depressed. Affect is tearful.         Behavior: Behavior normal.          No Known Allergies  Current Outpatient Medications   Medication Sig Dispense Refill    dilTIAZem (CARDIZEM CD) 240 MG extended release capsule Take 1 capsule by mouth daily 90 capsule 3    LINZESS 290 MCG CAPS capsule TAKE 1 CAPSULE BY MOUTH EVERY DAY 90 capsule 1    sertraline (ZOLOFT) 25 MG tablet Take 1 tablet by mouth daily 30 tablet 1    clobetasol (TEMOVATE) 0.05 % external solution PLEASE SEE ATTACHED FOR DETAILED DIRECTIONS      hydrOXYzine HCl (ATARAX) 25 MG tablet       ketoconazole (NIZORAL) 2 % cream APPLY ONCE A DAY BETWEEN BROWS AND SIDES OF NOSE.      Calcium Citrate-Vitamin D 250-5 MG-MCG TABS Take by mouth      Ascorbic Acid (VITAMIN C) 500 MG CAPS Take by mouth      famotidine (PEPCID) 20 MG tablet Take 1 tablet by mouth 2 times daily      clindamycin (CLEOCIN T) 1 % external solution APPLY TO THE BACK OF SCALP EVERY MORNING.      Magnesium 400 MG TABS Take by mouth      gabapentin (NEURONTIN) 300 MG capsule Take 1 capsule by mouth 3 times daily for 90 days. Max Daily Amount: 900 mg 90 capsule 3    desonide (DESOWEN) 0.05 % cream Apply topically 2 times daily. 60 g 1     No  current facility-administered medications for this visit.       Reviewed and updated this visit:  Tobacco  Allergies  Meds  Problems  Med Hx  Surg Hx  Soc Hx  Fam Hx     Travis Weaver, was evaluated through a synchronous (real-time) audio-video encounter. The patient (or guardian if applicable) is aware that this is a billable service, which includes applicable co-pays. This Virtual Visit was conducted with patient's (and/or legal guardian's) consent. Patient identification was verified, and a caregiver was present when appropriate.   The patient was located at Other: at work  Provider was located at Home ([place]         Services were provided through a video synchronous discussion virtually to substitute for in-person encounter.    --Elyse Hsu, APRN - NP on 03/08/2022 at 8:29 AM    An electronic signature was used to authenticate this note.

## 2022-03-09 ENCOUNTER — Encounter: Admit: 2022-03-09 | Discharge: 2022-03-09 | Payer: BLUE CROSS/BLUE SHIELD | Primary: Family

## 2022-03-10 LAB — CBC
Hematocrit: 42.6 % (ref 37.5–51.0)
Hemoglobin: 14.3 g/dL (ref 13.0–17.7)
MCH: 31.6 pg (ref 26.6–33.0)
MCHC: 33.6 g/dL (ref 31.5–35.7)
MCV: 94 fL (ref 79–97)
Platelets: 332 10*3/uL (ref 150–450)
RBC: 4.52 x10E6/uL (ref 4.14–5.80)
RDW: 13.1 % (ref 11.6–15.4)
WBC: 7.6 10*3/uL (ref 3.4–10.8)

## 2022-03-10 LAB — COMPREHENSIVE METABOLIC PANEL
ALT: 24 IU/L (ref 0–44)
AST: 20 IU/L (ref 0–40)
Albumin/Globulin Ratio: 2.3 — ABNORMAL HIGH (ref 1.2–2.2)
Albumin: 4.8 g/dL (ref 3.8–4.9)
Alkaline Phosphatase: 44 IU/L (ref 44–121)
BUN/Creatinine Ratio: 20 (ref 9–20)
BUN: 20 mg/dL (ref 6–24)
CO2: 23 mmol/L (ref 20–29)
Calcium: 9.7 mg/dL (ref 8.7–10.2)
Chloride: 103 mmol/L (ref 96–106)
Creatinine: 0.98 mg/dL (ref 0.76–1.27)
Est, Glomerular Filtration Rate: 89 mL/min/{1.73_m2} (ref >59)
Globulin, Total: 2.1 g/dL (ref 1.5–4.5)
Glucose: 98 mg/dL (ref 70–99)
Potassium: 4.9 mmol/L (ref 3.5–5.2)
Sodium: 141 mmol/L (ref 134–144)
Total Bilirubin: 0.3 mg/dL (ref 0.0–1.2)
Total Protein: 6.9 g/dL (ref 6.0–8.5)

## 2022-03-10 LAB — PSA SCREENING: PSA: 1.3 ng/mL (ref 0.0–4.0)

## 2022-03-10 LAB — LIPID PANEL
Cholesterol: 201 mg/dL — ABNORMAL HIGH (ref 100–199)
HDL: 86 mg/dL (ref >39)
LDL Calculated: 106 mg/dL — ABNORMAL HIGH (ref 0–99)
Triglycerides: 47 mg/dL (ref 0–149)
VLDL Cholesterol Calculated: 9 mg/dL (ref 5–40)

## 2022-03-10 LAB — HEPATITIS C ANTIBODY: Hepatitis C Ab: NONREACTIVE

## 2022-03-12 MED ORDER — OMEPRAZOLE 20 MG PO CPDR
20 | ORAL_CAPSULE | Freq: Every day | ORAL | 3 refills | Status: AC
Start: 2022-03-12 — End: ?

## 2022-03-12 NOTE — Telephone Encounter (Signed)
-----   Message from Jerrye Beavers sent at 03/11/2022  1:19 PM EST -----  Regarding: Refill  Contact: [phone]  Hi Katri,  I was mistaken about my medicines that I take. I do not take the Famotidine. I take the Omeprazole 20mg  once daily. I need a refill please.

## 2022-03-15 ENCOUNTER — Encounter: Payer: BLUE CROSS/BLUE SHIELD | Attending: Family | Primary: Family

## 2022-04-04 ENCOUNTER — Encounter

## 2022-04-05 ENCOUNTER — Encounter: Payer: BLUE CROSS/BLUE SHIELD | Primary: Family

## 2022-04-05 ENCOUNTER — Telehealth: Admit: 2022-04-05 | Discharge: 2022-04-05 | Payer: BLUE CROSS/BLUE SHIELD | Attending: Family | Primary: Family

## 2022-04-05 DIAGNOSIS — U071 COVID-19: Secondary | ICD-10-CM

## 2022-04-05 MED ORDER — PREDNISONE 20 MG PO TABS
20 | ORAL_TABLET | ORAL | 0 refills | Status: AC
Start: 2022-04-05 — End: 2022-04-10

## 2022-04-05 MED ORDER — ONDANSETRON HCL 4 MG PO TABS
4 MG | ORAL_TABLET | Freq: Three times a day (TID) | ORAL | 0 refills | Status: AC | PRN
Start: 2022-04-05 — End: 2022-05-21

## 2022-04-05 MED ORDER — NIRMATRELVIR&RITONAVIR 300/100 20 X 150 MG & 10 X 100MG PO TBPK
20 | ORAL_TABLET | ORAL | 0 refills | Status: AC
Start: 2022-04-05 — End: 2022-04-10

## 2022-04-05 MED ORDER — PROMETHAZINE-DM 6.25-15 MG/5ML PO SYRP
Freq: Four times a day (QID) | ORAL | 0 refills | Status: AC | PRN
Start: 2022-04-05 — End: 2022-04-12

## 2022-04-05 NOTE — Progress Notes (Signed)
 Travis Weaver is a 57 y.o. male who was seen via telemedicine today 04/05/2022).    Assessment & Plan:   Below is the assessment and plan developed based on review of pertinent history, physical exam, labs, studies, and medications.    1. COVID-19  Comments:  treat with paxlovid , cough supressant and steroids given coughing fits during exam.   Orders:  -     predniSONE  (DELTASONE ) 20 MG tablet; Take 3 tablets by mouth daily for 2 days, THEN 2 tablets daily for 2 days, THEN 1 tablet daily for 2 days., Disp-12 tablet, R-0Normal  -     promethazine -dextromethorphan (PROMETHAZINE -DM) 6.25-15 MG/5ML syrup; Take 5 mLs by mouth 4 times daily as needed for Cough, Disp-150 mL, R-0Normal  -     nirmatrelvir /ritonavir  300/100 (PAXLOVID , 300/100,) 20 x 150 MG & 10 x 100MG  TBPK; Take 3 tablets (two 150 mg nirmatrelvir  and one 100 mg ritonavir  tablets) by mouth every 12 hours for 5 days., Disp-30 tablet, R-0Normal  2. Nausea  Comments:  zofran  PRN until he is seen and treated by psychiatrist, most likely secondary to mood.  If no improvement, will consider additional work up  Orders:  -     ondansetron  (ZOFRAN ) 4 MG tablet; Take 1 tablet by mouth 3 times daily as needed for Nausea or Vomiting, Disp-30 tablet, R-0Normal      No follow-ups on file.    Subjective:   Travis Weaver was seen today for Positive For Covid-19     Patient c/o nasal congestion, runny nose, sore throat and cough x2 days.   Patient reported that he has a fever of 101 today.   Patient's wife recently tested positive for covid.   He took a home covid test yesterday that was positive.    Patient is using nyquil with mild relief.      Patient reported that separate from the covid, he has been having intermittent episodes of nausea over the past 6 weeks or so.  He is struggling with depression, anxiety and PTSD.  Has an appointment with a psychiatrist next week.  Did not feel that the zoloft  was helpful and he quit taking it.      Review of Systems   Constitutional:   Positive for fatigue and fever.   HENT:  Positive for congestion, rhinorrhea and sore throat.    Respiratory:  Positive for cough and chest tightness.    Neurological:  Negative for dizziness.          Objective:     There were no vitals filed for this visit.   There is no height or weight on file to calculate BMI.     Physical Exam  Constitutional:       Appearance: Normal appearance.   HENT:      Head: Normocephalic.   Eyes:      Conjunctiva/sclera: Conjunctivae normal.   Pulmonary:      Effort: Pulmonary effort is normal.      Comments: Dry hacking cough during exam    Skin:     Coloration: Skin is not pale.   Neurological:      Mental Status: He is alert and oriented to person, place, and time.   Psychiatric:         Mood and Affect: Mood normal.         Behavior: Behavior normal.          No Known Allergies    Current Outpatient Medications   Medication Sig Dispense  Refill    predniSONE  (DELTASONE ) 20 MG tablet Take 3 tablets by mouth daily for 2 days, THEN 2 tablets daily for 2 days, THEN 1 tablet daily for 2 days. 12 tablet 0    promethazine -dextromethorphan (PROMETHAZINE -DM) 6.25-15 MG/5ML syrup Take 5 mLs by mouth 4 times daily as needed for Cough 150 mL 0    nirmatrelvir /ritonavir  300/100 (PAXLOVID , 300/100,) 20 x 150 MG & 10 x 100MG  TBPK Take 3 tablets (two 150 mg nirmatrelvir  and one 100 mg ritonavir  tablets) by mouth every 12 hours for 5 days. 30 tablet 0    ondansetron  (ZOFRAN ) 4 MG tablet Take 1 tablet by mouth 3 times daily as needed for Nausea or Vomiting 30 tablet 0    omeprazole  (PRILOSEC) 20 MG delayed release capsule Take 1 capsule by mouth daily 90 capsule 3    dilTIAZem  (CARDIZEM  CD) 240 MG extended release capsule Take 1 capsule by mouth daily 90 capsule 3    LINZESS  290 MCG CAPS capsule TAKE 1 CAPSULE BY MOUTH EVERY DAY 90 capsule 1    clobetasol (TEMOVATE) 0.05 % external solution PLEASE SEE ATTACHED FOR DETAILED DIRECTIONS      hydrOXYzine  HCl (ATARAX ) 25 MG tablet       ketoconazole  (NIZORAL) 2 % cream APPLY ONCE A DAY BETWEEN BROWS AND SIDES OF NOSE.      Calcium Citrate-Vitamin D 250-5 MG-MCG TABS Take by mouth      Ascorbic Acid (VITAMIN C) 500 MG CAPS Take by mouth      clindamycin (CLEOCIN T) 1 % external solution APPLY TO THE BACK OF SCALP EVERY MORNING.      Magnesium 400 MG TABS Take by mouth      desonide  (DESOWEN ) 0.05 % cream Apply topically 2 times daily. 60 g 1    gabapentin  (NEURONTIN ) 300 MG capsule Take 1 capsule by mouth 3 times daily for 90 days. Max Daily Amount: 900 mg 90 capsule 3     No current facility-administered medications for this visit.       Reviewed and updated this visit:  Tobacco  Allergies  Meds  Problems  Med Hx  Surg Hx  Soc Hx  Fam Hx     Travis Weaver, was evaluated through a synchronous (real-time) audio-video encounter. The patient (or guardian if applicable) is aware that this is a billable service, which includes applicable co-pays. This Virtual Visit was conducted with patient's (and/or legal guardian's) consent. Patient identification was verified, and a caregiver was present when appropriate.   The patient was located at Home: [address]  [place] [address]  Provider was located at Home (Appt Dept State): [place]         Services were provided through a video synchronous discussion virtually to substitute for in-person encounter.    --Gerri LITTIE Chang, APRN - NP on 04/05/2022 at 12:28 PM    An electronic signature was used to authenticate this note.

## 2022-04-05 NOTE — Progress Notes (Signed)
 Chief Complaint   Patient presents with    Positive For Covid-19     Have you been to the ER, urgent care clinic since your last visit?  Hospitalized since your last visit?    NO    "Have you seen or consulted any other health care providers outside of [facility] since your last visit?"    NO

## 2022-04-05 NOTE — Telephone Encounter (Signed)
Pt called in this morning asking if he needs to make a virtual he said he was unsure at what the message below meant he said he was just told to send a pic of his test only please advise

## 2022-04-05 NOTE — Telephone Encounter (Signed)
From: Jerrye Beavers  To: Elyse Hsu  Sent: 04/04/2022 4:26 PM EST  Subject: Covid 19 test results.     Here is a picture after 5 min.

## 2022-05-03 ENCOUNTER — Encounter: Admit: 2022-05-03 | Discharge: 2022-05-03 | Payer: BLUE CROSS/BLUE SHIELD | Attending: Family | Primary: Family

## 2022-05-03 DIAGNOSIS — F4312 Post-traumatic stress disorder, chronic: Secondary | ICD-10-CM

## 2022-05-03 MED ORDER — PAROXETINE HCL 20 MG PO TABS
20 MG | ORAL_TABLET | Freq: Every day | ORAL | 1 refills | Status: DC
Start: 2022-05-03 — End: 2022-05-24

## 2022-05-03 NOTE — Assessment & Plan Note (Signed)
Monitored by specialist- no acute findings meriting change in the plan

## 2022-05-03 NOTE — Progress Notes (Signed)
Travis Weaver is a 57 y.o. male who was seen via telemedicine today 05/03/2022).    Assessment & Plan:   Below is the assessment and plan developed based on review of pertinent history, physical exam, labs, studies, and medications.    1. Chronic post-traumatic stress disorder (PTSD)  Comments:  not controlled,start on paxil and titrate up.  Discussed with patient it can take some weeks to be therapeutic  consider psychiatrist if no change to mood.  Orders:  -     PARoxetine (PAXIL) 20 MG tablet; Take 1 tablet by mouth daily, Disp-30 tablet, R-1Normal  2. Simple chronic bronchitis (HCC)  Assessment & Plan:   Monitored by specialist- no acute findings meriting change in the plan      Return in about 4 weeks (around 05/31/2022) for PTSD follow up.    Subjective:   Travis Weaver was seen today for Mental Health Problem     Patient reported that he has had four appointments with his psychologist and reported he is unable to barely discuss the topics because he will start crying and is unable to discuss the deeper details.  Patient reported he is tearful during the day at work and is worse in the evening.  He reported that at night he is using alcohol to cope but he recognizes that alcohol is not the answer.     He was dx with chronic PTSD by his psychologist.  He also did come in contact with a peer who has been [place] and experienced similar situations, encouraged to discuss more with this person about their experiences.     Psychologist through [employer].       Review of Systems   Psychiatric/Behavioral:  Positive for agitation and dysphoric mood. The patient is nervous/anxious.           Objective:     There were no vitals filed for this visit.   There is no height or weight on file to calculate BMI.     Physical Exam  Constitutional:       Appearance: Normal appearance.   HENT:      Head: Normocephalic.   Eyes:      Conjunctiva/sclera: Conjunctivae normal.   Pulmonary:      Effort: Pulmonary effort is normal.   Skin:      Coloration: Skin is not pale.   Neurological:      Mental Status: He is alert and oriented to person, place, and time.   Psychiatric:         Attention and Perception: Attention normal.         Mood and Affect: Mood is depressed. Affect is tearful.         Speech: Speech normal.         Behavior: Behavior is cooperative.          No Known Allergies    Current Outpatient Medications   Medication Sig Dispense Refill    PARoxetine (PAXIL) 20 MG tablet Take 1 tablet by mouth daily 30 tablet 1    ondansetron (ZOFRAN) 4 MG tablet Take 1 tablet by mouth 3 times daily as needed for Nausea or Vomiting 30 tablet 0    omeprazole (PRILOSEC) 20 MG delayed release capsule Take 1 capsule by mouth daily 90 capsule 3    dilTIAZem (CARDIZEM CD) 240 MG extended release capsule Take 1 capsule by mouth daily 90 capsule 3    LINZESS 290 MCG CAPS capsule TAKE 1 CAPSULE BY MOUTH EVERY DAY 90  capsule 1    clobetasol (TEMOVATE) 0.05 % external solution PLEASE SEE ATTACHED FOR DETAILED DIRECTIONS      hydrOXYzine HCl (ATARAX) 25 MG tablet       ketoconazole (NIZORAL) 2 % cream APPLY ONCE A DAY BETWEEN BROWS AND SIDES OF NOSE.      Calcium Citrate-Vitamin D 250-5 MG-MCG TABS Take by mouth      Ascorbic Acid (VITAMIN C) 500 MG CAPS Take by mouth      clindamycin (CLEOCIN T) 1 % external solution APPLY TO THE BACK OF SCALP EVERY MORNING.      Magnesium 400 MG TABS Take by mouth      desonide (DESOWEN) 0.05 % cream Apply topically 2 times daily. 60 g 1    gabapentin (NEURONTIN) 300 MG capsule Take 1 capsule by mouth 3 times daily for 90 days. Max Daily Amount: 900 mg 90 capsule 3     No current facility-administered medications for this visit.       Reviewed and updated this visit:  Tobacco  Allergies  Meds  Problems  Med Hx  Surg Hx  Soc Hx  Fam Hx     Pilar Jarvisodd B Windt, was evaluated through a synchronous (real-time) audio-video encounter. The patient (or guardian if applicable) is aware that this is a billable service, which includes  applicable co-pays. This Virtual Visit was conducted with patient's (and/or legal guardian's) consent. Patient identification was verified, and a caregiver was present when appropriate.   The patient was located at Other: at his work  Provider was located at Home (Appt Dept State[place]  Confirm you are appropriately licensed, registered, or certified to deliver care in the state where the patient is located as indicated above. If you are not or unsure, please re-schedule the visit: Yes, I confirm.        Services were provided through a video synchronous discussion virtually to substitute for in-person encounter.    --Court JoyKatri L Rudine Rieger, APRN - NP on 05/03/2022 at 8:19 AM    An electronic signature was used to authenticate this note.

## 2022-05-03 NOTE — Progress Notes (Signed)
Chief Complaint   Patient presents with    Mental Health Problem     "Have you been to the ER, urgent care clinic since your last visit?  Hospitalized since your last visit?"    NO    "Have you seen or consulted any other health care providers outside of [facility] since your last visit?"    NO          Click Here for Release of Records Request

## 2022-05-20 ENCOUNTER — Encounter

## 2022-05-21 MED ORDER — ONDANSETRON HCL 4 MG PO TABS
4 | ORAL_TABLET | Freq: Three times a day (TID) | ORAL | 1 refills | Status: AC | PRN
Start: 2022-05-21 — End: ?

## 2022-05-22 ENCOUNTER — Ambulatory Visit: Admit: 2022-05-22 | Discharge: 2022-05-22 | Payer: BLUE CROSS/BLUE SHIELD | Attending: Otolaryngology | Primary: Family

## 2022-05-22 DIAGNOSIS — R053 Chronic cough: Secondary | ICD-10-CM

## 2022-05-22 MED ORDER — GABAPENTIN 300 MG PO CAPS
300 | ORAL_CAPSULE | Freq: Three times a day (TID) | ORAL | 1 refills | Status: AC
Start: 2022-05-22 — End: 2022-08-20

## 2022-05-22 NOTE — Progress Notes (Signed)
Otolaryngology-Head and Neck Surgery  Follow Up Patient Visit     Patient: Travis Weaver  Date of Birth: Jul 13, 1965  MRN: [id number]  Date of Service: 05/22/2022      Chief Complaint: Chronic cough     Interval hx 05/22/2022  Doing very well as far as cough   On gabapentin TID and omeprazole 20    Interval hx 07/25/2021  Had been doing better without cough for some months  Was able to wean prilosec to PRN  However was having a hard time remembering to take middle dose of gabapentin and did BID dosing instead  Then 2 weeks ago developed ? URI, and has had subsequent persistent, severe, dry cough    Interval hx 04/25/2021  Cough doing better  Weaned prilosec to 40 mg daily  On gabapentin 300 mg TID     Interval hx 01/26/2021  Doing better for the last month   On singulair and BID 40 mg prilosec     Interval hx 09/07/2020  Cough better in the last few weeks  Was given prednisone and cough suppressant via mychart - prednisone did not help    Cough seems to come and go however - periods where it can be better and then gets worse again    Currently on Pepcid, Prilosec, flonase    History of Present Illness: Travis Weaver is a 57 y.o. year old male who was last seen by Dr Sherlyn Lick for chronic cough a few months ago    Over the last 1 year, and developed chronic cough   Intermittent, largely dry  Very bothersome, worse at night     Has tried consistent zyrtec without relief  Has seen pulm - Dr Mitzi Hansen; had neg chest CT and pulm function testing   Was taken off lisinopril   Takes pepcid 20 mg at baseline; saw Dr Sherlyn Lick - increased to 40 mg BID, without change     Denies post nasal drip, allergies, GERD concerns       Past Medical History:  Past Medical History:   Diagnosis Date    Contact dermatitis and eczema due to cause     Hypertension        Past Surgical History:   History reviewed. No pertinent surgical history.    Medications:   Current Outpatient Medications   Medication Instructions    albuterol (PROVENTIL HFA, VENTOLIN HFA,  PROAIR HFA) 90 mcg/actuation inhaler 1 Puff, Inhalation, EVERY 6 HOURS AS NEEDED    benzonatate (TESSALON) 100 mg, Oral, 3 TIMES DAILY    dilTIAZem ER (CARDIZEM CD) 240 mg capsule TAKE 1 CAPSULE BY MOUTH EVERY DAY    fluticasone propionate (FLONASE) 50 mcg/actuation nasal spray SPRAY 2 SPRAYS INTO EACH NOSTRIL EVERY DAY    gabapentin (NEURONTIN) 100 mg capsule WEEK 1: 100 mg TID x 7 days. WEEK 2: 100 mg AM, afternoon, 200 mg PM. WEEK 3: 100 mg AM, 200 mg afternoon, 200 mg PM.  WEEK 4. 200 mg TID.    hydrOXYzine pamoate (VISTARIL) 25 mg, Oral, 3 TIMES DAILY AS NEEDED    Linzess 290 mcg cap capsule TAKE 1 CAPSULE BY MOUTH EVERY DAY    omeprazole (PRILOSEC) 40 mg, Oral, DAILY       Allergies:   No Known Allergies    Social History:   Social History     Tobacco Use    Smoking status: Former     Packs/day: 1.00     Years: 35.00  Pack years: 35.00     Types: Cigarettes     Quit date: 2016     Years since quitting: 7.2    Smokeless tobacco: Never    Tobacco comments:     Patient vapes   Vaping Use    Vaping Use: Every day    Substances: Nicotine    Devices: Refillable tank   Substance Use Topics    Alcohol use: Never    Drug use: Never       Family History:  Family History   Problem Relation Age of Onset    OSTEOARTHRITIS Mother     Cancer Mother     Elevated Lipids Mother     Heart Disease Mother     Hypertension Mother     OSTEOARTHRITIS Father     Cancer Father     Elevated Lipids Father     Hypertension Father     Hypertension Sister     OSTEOARTHRITIS Brother        Review of Systems:  Consitutional: denies fever, excessive weight gain or loss.  Eyes: denies diplopia, eye pain.  Integumentary: denies new concerning skin lesions.  Ears, Nose, Mouth, Throat: denies except as per HPI.  Endocrine: denies hot or cold intolerance, increased thirst.  Respiratory: denies cough, hemoptysis, wheezing  Gastrointestinal: denies trouble swallowing, nausea, emesis, regurgitation  Musculoskeletal: denies muscle weakness or  wasting  Cardiovascular: denies chest pain, shortness of breath  Neurologic: denies seizures, numbness or tingling, syncope  Hematologic: denies easy bleeding or bruising    Physical Examination:   Vitals:    04/25/21 1542   BP: 120/80   BP 1 Location: Left upper arm   BP Patient Position: Sitting   BP Cuff Size: Adult   Pulse: 72   Resp: 18   Height: 5\' 7"  (1.702 m)   Weight: 192 lb (87.1 kg)   SpO2: 98%         General: Comfortable, pleasant, appears stated age  Voice: Strong, speaking in full sentences, no stridor. Harsh cough throughout duration of encounter, as severe as our initial meeting  Face: No masses or lesions, facial strength symmetric   Ears: External ears unremarkable. Bilateral ear canal clear. Tympanic membrane clear and intact, with visible landmarks. Clear middle ear space  Nose: External nose unremarkable. Dorsum midline. Anterior rhinoscopy demonstrates no lesions. Septum midline. Turbinates without hypertrophy.  Oral Cavity / Oropharynx: No trismus. Mucosa pink and moist. No lesions. Tongue is midline and mobile. Palate elevates symmetrically. Uvula midline. Tonsils unremarkable. Base of tongue soft. Floor of mouth soft.   Neck: Supple. No adenopathy. Thyroid unremarkable. Palpable laryngeal landmarks. Full neck range of motion   Neurologic: CN II - XI intact. Normal gait      Assessment and Plan:   Chronic cough   - Scope exam suggestive of LPR / GERD   - Has stopped ACE inh, and we've tried consistent zyrtec + flonase and singulair  - Chest CT negative and PFT negative  - GI endoscopy was negative - has not had objective reflux testing  - He did a prolonged trial of PPI - this has been refilled by his PCP. Dealing with some nausea as well, so cont PPI for now but ultimately go to PRN  - Ultimately we trialed gabapentin and he's been on 300 mg TID for over 1 year with good symptom relief  - We will work to transition to BID   - He is currently working with his PCP and  psychologist to address  mental health issues and is making progress in this regard; once this is more stable, then we will reduce the gabapentin    The patient was instructed to return to clinic if no improvement or progression of symptoms. Signs to watch out for reviewed.      Llana Aliment, MD   [facility]  [address] [facility] [address]  [place], [place] [address]  Office Phone: [phone]

## 2022-05-23 ENCOUNTER — Encounter

## 2022-05-24 MED ORDER — PAROXETINE HCL 30 MG PO TABS
30 MG | ORAL_TABLET | Freq: Every day | ORAL | 1 refills | Status: DC
Start: 2022-05-24 — End: 2022-06-15

## 2022-05-24 NOTE — Telephone Encounter (Signed)
From: Pilar Jarvis  To: Court Joy  Sent: 05/23/2022 4:44 PM EDT  Subject: Medication.     Hello Travis Weaver,    I am doing better but not quite there yet. Can we increase to the next level and see if that helps to get me to where I think I need to be.   Thank you so much for your help.

## 2022-06-15 ENCOUNTER — Encounter

## 2022-06-15 MED ORDER — PAROXETINE HCL 30 MG PO TABS
30 MG | ORAL_TABLET | Freq: Every day | ORAL | 1 refills | Status: AC
Start: 2022-06-15 — End: 2022-09-17

## 2022-06-26 ENCOUNTER — Encounter

## 2022-06-27 MED ORDER — HYDROXYZINE HCL 25 MG PO TABS
25 MG | ORAL_TABLET | Freq: Three times a day (TID) | ORAL | 1 refills | Status: DC | PRN
Start: 2022-06-27 — End: 2022-07-19

## 2022-06-27 NOTE — Telephone Encounter (Signed)
From: Pilar Jarvis  To: Court Joy  Sent: 06/26/2022 7:50 PM EDT  Subject: Refill    Hi Keldan Eplin,    I am trying to refill Hydroxyzine Pam 25 mg

## 2022-07-03 ENCOUNTER — Encounter

## 2022-07-04 NOTE — Telephone Encounter (Signed)
From: Pilar Jarvis  To: Court Joy  Sent: 07/03/2022 8:01 AM EDT  Subject: Tiredness     Hey Arta Silence,  I have been having problems for several months staying awake during the day. This started well before I started the new medication. It normally takes 5 to 10 min to go to sleep. Most of the time I do not wake up at night. Sometimes I toss and turn a lot. I have had 2 sleep studies in the past. The first one showed I needed a CPap. Tried for 2 weeks, it didn't help. Dr. Princess Bruins to return since it didn't help. The last one was 6 years ago. Dr. York Spaniel I needed to lose weight and stop smoking. I am always tired but now I dose off when I shouldn't.   Sorry for the long message.

## 2022-07-04 NOTE — Telephone Encounter (Signed)
-----   Message from Llana Aliment, MD sent at 07/04/2022 10:58 AM EDT -----  Regarding: RE: Trouble swallowing.   Contact: [phone]  I can see him June 19 or 20     ----- Message -----  From: Ardell Isaacs, LPN  Sent: 03/04/4008   9:23 AM EDT  To: Llana Aliment, MD  Subject: FW: Trouble swallowing.                            ----- Message -----  From: Travis Weaver  Sent: 07/03/2022   8:59 AM EDT  To: #  Subject: Trouble swallowing.                              Good morning ,  I have an appointment you on July 15th for this problem. For the last week when I try to eat (most of the time) the first 4 or 5 bites go down good. Then it feels like it is stuck. I can still breathe ok. If I try to drink water that doesn't help. It all has to come back out.  Sometimes when my throat feels a little funny I try to drink water. Sometimes even that will not go down. I don't think I can wait until July 15. Can I get in sooner or can you refer me.

## 2022-07-04 NOTE — Telephone Encounter (Signed)
LVM for the patient to call the office in regards to his appt and requesting a sooner appt.

## 2022-07-13 NOTE — Telephone Encounter (Signed)
LVM to schedule sleep consult per Court Joy, APRN - NP for non restorative sleep.

## 2022-07-18 ENCOUNTER — Ambulatory Visit: Admit: 2022-07-18 | Discharge: 2022-07-18 | Payer: BLUE CROSS/BLUE SHIELD | Attending: Otolaryngology | Primary: Family

## 2022-07-18 DIAGNOSIS — R053 Chronic cough: Secondary | ICD-10-CM

## 2022-07-18 MED ORDER — PANTOPRAZOLE SODIUM 40 MG PO TBEC
40 | ORAL_TABLET | Freq: Every day | ORAL | 1 refills | Status: AC
Start: 2022-07-18 — End: ?

## 2022-07-18 MED ORDER — SUCRALFATE 1 G PO TABS
1 GM | ORAL_TABLET | Freq: Every evening | ORAL | 1 refills | Status: AC
Start: 2022-07-18 — End: 2023-01-25

## 2022-07-18 NOTE — Progress Notes (Signed)
Otolaryngology-Head and Neck Surgery  Follow Up Patient Visit     Patient: Travis Weaver  Date of Birth: Dec 21, 1965  MRN: [id number]  Date of Service: 07/18/2022      Chief Complaint: Chronic cough     Interval hx 07/18/2022  Weaned from gabapentin end of April    Did well until 1 mo ago when then developed worsening cough, dysphagia  A few episodes overnight, waking up and feeling as cannot breathe  Feeling reflux events into throat    Hx of esophageal dilation 10 + years ago  Prior endoscopy Dr Jearld Lesch 2years ago with findings of gastritis     Interval hx 04/2022  Doing very well as far as cough   On gabapentin TID and omeprazole 20 mg    Interval hx 07/25/2021  Had been doing better without cough for some months  Was able to wean prilosec to PRN  However was having a hard time remembering to take middle dose of gabapentin and did BID dosing instead  Then 2 weeks ago developed ? URI, and has had subsequent persistent, severe, dry cough    Interval hx 04/25/2021  Cough doing better  Weaned prilosec to 40 mg daily  On gabapentin 300 mg TID     Interval hx 01/26/2021  Doing better for the last month   On singulair and BID 40 mg prilosec     Interval hx 09/07/2020  Cough better in the last few weeks  Was given prednisone and cough suppressant via mychart - prednisone did not help    Cough seems to come and go however - periods where it can be better and then gets worse again    Currently on Pepcid, Prilosec, flonase    History of Present Illness: Travis Weaver is a 57 y.o. year old male who was last seen by Dr Sherlyn Lick for chronic cough a few months ago    Over the last 1 year, and developed chronic cough   Intermittent, largely dry  Very bothersome, worse at night     Has tried consistent zyrtec without relief  Has seen pulm - Dr Mitzi Hansen; had neg chest CT and pulm function testing   Was taken off lisinopril   Takes pepcid 20 mg at baseline; saw Dr Sherlyn Lick - increased to 40 mg BID, without change     Denies post nasal drip,  allergies, GERD concerns       Past Medical History:  Past Medical History:   Diagnosis Date    Contact dermatitis and eczema due to cause     Hypertension        Past Surgical History:   History reviewed. No pertinent surgical history.    Medications:   Current Outpatient Medications   Medication Instructions    albuterol (PROVENTIL HFA, VENTOLIN HFA, PROAIR HFA) 90 mcg/actuation inhaler 1 Puff, Inhalation, EVERY 6 HOURS AS NEEDED    benzonatate (TESSALON) 100 mg, Oral, 3 TIMES DAILY    dilTIAZem ER (CARDIZEM CD) 240 mg capsule TAKE 1 CAPSULE BY MOUTH EVERY DAY    fluticasone propionate (FLONASE) 50 mcg/actuation nasal spray SPRAY 2 SPRAYS INTO EACH NOSTRIL EVERY DAY    gabapentin (NEURONTIN) 100 mg capsule WEEK 1: 100 mg TID x 7 days. WEEK 2: 100 mg AM, afternoon, 200 mg PM. WEEK 3: 100 mg AM, 200 mg afternoon, 200 mg PM.  WEEK 4. 200 mg TID.    hydrOXYzine pamoate (VISTARIL) 25 mg, Oral, 3 TIMES DAILY AS NEEDED  Linzess 290 mcg cap capsule TAKE 1 CAPSULE BY MOUTH EVERY DAY    omeprazole (PRILOSEC) 40 mg, Oral, DAILY       Allergies:   No Known Allergies    Social History:   Social History     Tobacco Use    Smoking status: Former     Packs/day: 1.00     Years: 35.00     Pack years: 35.00     Types: Cigarettes     Quit date: 2016     Years since quitting: 7.2    Smokeless tobacco: Never    Tobacco comments:     Patient vapes   Vaping Use    Vaping Use: Every day    Substances: Nicotine    Devices: Refillable tank   Substance Use Topics    Alcohol use: Never    Drug use: Never       Family History:  Family History   Problem Relation Age of Onset    OSTEOARTHRITIS Mother     Cancer Mother     Elevated Lipids Mother     Heart Disease Mother     Hypertension Mother     OSTEOARTHRITIS Father     Cancer Father     Elevated Lipids Father     Hypertension Father     Hypertension Sister     OSTEOARTHRITIS Brother        Review of Systems:  Consitutional: denies fever, excessive weight gain or loss.  Eyes: denies  diplopia, eye pain.  Integumentary: denies new concerning skin lesions.  Ears, Nose, Mouth, Throat: denies except as per HPI.  Endocrine: denies hot or cold intolerance, increased thirst.  Respiratory: denies cough, hemoptysis, wheezing  Gastrointestinal: denies trouble swallowing, nausea, emesis, regurgitation  Musculoskeletal: denies muscle weakness or wasting  Cardiovascular: denies chest pain, shortness of breath  Neurologic: denies seizures, numbness or tingling, syncope  Hematologic: denies easy bleeding or bruising    Physical Examination:   BP 138/82   Pulse (!) 106   Ht 1.702 m (5\' 7" )   Wt 97.5 kg (215 lb)   SpO2 98%   BMI 33.67 kg/m         General: Comfortable, pleasant, appears stated age  Voice: Strong, speaking in full sentences, no stridor. Harsh cough throughout duration of encounter, as severe as our initial meeting  Face: No masses or lesions, facial strength symmetric   Ears: External ears unremarkable. Bilateral ear canal clear. Tympanic membrane clear and intact, with visible landmarks. Clear middle ear space  Nose: External nose unremarkable. Dorsum midline. Anterior rhinoscopy demonstrates no lesions. Septum midline. Turbinates without hypertrophy.  Oral Cavity / Oropharynx: No trismus. Mucosa pink and moist. No lesions. Tongue is midline and mobile. Palate elevates symmetrically. Uvula midline. Tonsils unremarkable. Base of tongue soft. Floor of mouth soft.   Neck: Supple. No adenopathy. Thyroid unremarkable. Palpable laryngeal landmarks. Full neck range of motion   Neurologic: CN II - XI intact. Normal gait      PROCEDURE: FLEXIBLE FIBEROPTIC LARYNGOSCOPY    Preoperative Diagnosis: Chronic cough     Postoperative Diagnosis: same     Procedure: Flexible Fiberoptic Laryngoscopy    Anesthesia: Topical 4% Lidocaine, Oxymetazoline    Description of Procedure: Verbal informed consent was obtained. After application of topical anesthetic and decongestant, the flexible fiberoptic endoscope  was introduced to the patient's nare. It was passed through the nose and into the pharynx. The scope was then withdrawn and repeated on the opposing nare.  The patient tolerated the procedure well.     Findings: Normal nasal cavity, no polyposis or purulence.  Middle meatus clear bilaterally. Nasopharynx without lesions. Base of tongue, vallecula & epiglottis unremarkable. Clear pyriform sinus. Vocal folds without lesions. Diffuse erythema of vocal folds with severe interarytenoid edema and erythema          Assessment and Plan:   Chronic cough   Dysphagia  - Scope exam suggestive of very significant GERD/LPR today   - Has stopped ACE inh, and we've tried consistent zyrtec + flonase and singulair in the past   - Chest CT negative and PFT negative  - GI endoscopy was negative 2 years ago - has not had objective reflux testing  - Ultimately we trialed gabapentin and he's been on 300 mg TID for over 1 year with good symptom relief  - He was able to discontinue this recently  - Cough may be related to cessation of gabapentin however his laryngeal exam and symptoms are suggestive of GERD/LPR  - Add pantoprazole 40 mg and carafate QHS  - Will refer back to GI  - Arrange barium swallow       The patient was instructed to return to clinic if no improvement or progression of symptoms. Signs to watch out for reviewed.      Llana Aliment, MD   [facility]  [address] [facility] [address]  [place], [place] [address]  Office Phone: [phone]

## 2022-07-19 ENCOUNTER — Encounter

## 2022-07-19 MED ORDER — HYDROXYZINE HCL 25 MG PO TABS
25 MG | ORAL_TABLET | Freq: Three times a day (TID) | ORAL | 1 refills | Status: AC | PRN
Start: 2022-07-19 — End: ?

## 2022-07-25 MED ORDER — HYDROXYZINE PAMOATE 25 MG PO CAPS
25 MG | ORAL_CAPSULE | ORAL | 1 refills | Status: AC
Start: 2022-07-25 — End: ?

## 2022-07-27 ENCOUNTER — Inpatient Hospital Stay: Admit: 2022-07-27 | Payer: BLUE CROSS/BLUE SHIELD | Attending: Otolaryngology | Primary: Family

## 2022-07-27 DIAGNOSIS — K219 Gastro-esophageal reflux disease without esophagitis: Secondary | ICD-10-CM

## 2022-07-27 MED ORDER — BARIUM SULFATE 60 % PO SUSP
60 | Freq: Once | ORAL | Status: AC | PRN
Start: 2022-07-27 — End: 2022-07-27
  Administered 2022-07-27: 14:00:00 100 mL via ORAL

## 2022-07-27 MED ORDER — BARIUM SULFATE 700 MG PO TABS
700 | Freq: Once | ORAL | Status: AC | PRN
Start: 2022-07-27 — End: 2022-07-27
  Administered 2022-07-27: 14:00:00 1 via ORAL

## 2022-07-27 MED ORDER — SOD BICARB-CITRIC AC-SIMETH 2.21-1.53-0.04 G PO PACK
Freq: Once | ORAL | Status: AC
Start: 2022-07-27 — End: 2022-07-27
  Administered 2022-07-27: 14:00:00 1 g via ORAL

## 2022-07-27 MED ORDER — BARIUM SULFATE 98 % PO SUSR
98 | Freq: Once | ORAL | Status: AC | PRN
Start: 2022-07-27 — End: 2022-07-27
  Administered 2022-07-27: 14:00:00 70 mL via ORAL

## 2022-07-27 MED FILL — LIQUID E-Z-PAQUE 60 % PO SUSP: 60 % | ORAL | Qty: 355

## 2022-07-27 MED FILL — E-Z-HD 98 % PO SUSR: 98 % | ORAL | Qty: 140

## 2022-07-27 MED FILL — E-Z-GAS II 2.21-1.53-0.04 G PO PACK: ORAL | Qty: 1

## 2022-07-27 MED FILL — E-Z-DISK 700 MG PO TABS: 700 MG | ORAL | Qty: 1

## 2022-07-27 NOTE — Other (Signed)
Can we let patient know that his swallow study is definitely showing reflux as well as difficulty with spasms of the muscles of the esophagus, impacting things flow into his stomach. I sent a GI referral when I saw him previously, can we please follow up on the referral and make sure he gets into GI for next steps

## 2022-07-31 NOTE — Telephone Encounter (Signed)
Hello! So I was following up on this referral. I completely forgot I had already faxed the referral to [facility] (6.20 & 6.21,) so I also faxed it to [facility] today (7.2) When I realized, I called [facility] to see if they had an appt. Made, but they said they called him and left voicemail's for him on 06/20 and 06/21.   Maybe [facility] will have better luck. Regardless, he has 2 options!

## 2022-08-03 NOTE — Telephone Encounter (Signed)
Patient will be seeing Dr.Young at gastro. Specialist @ [facility]  [address] [place], [place] [address]. Office: [phone] Fax: [phone]    Faxing everything over now.

## 2022-08-09 NOTE — Progress Notes (Signed)
Subjective:   Patient ID: Travis Weaver is a 57 y.o. male.    Chief Complaint: Pain of the Left Hand       The patient is a 57 y.o. yo with a history of left cts, has r cts s/p ctr with good result.  Has had recurrent butsimilar symptoms as prior to previous injection which did offer temporary relief.  Is enquiring about further treatment options.    ROS: no fevers, chills  No nausea, vomiting  No recent chest pain, shortness of breath      Objective:   Constitutional:  No acute distress. Well nourished. Well developed.  Eyes:  Sclera are nonicteric.  Respiratory:  No labored breathing.  Cardiovascular:  No marked edema.  Skin:  No marked skin ulcers.  Neurological:  No marked sensory loss noted.  Psychiatric: Alert and oriented x3.    Exam is essentially unchanged and significant for + tinels, no atrophy, nl strength and sensibility      Procedures:    Carpal Tunnel L Wrist      Performed by: Pollyann Glen, MD  Authorized by: Pollyann Glen, MD    Procedure Details:  Procedure: Carpal Tunnel  Consent Given by:  Patient  Timeout: prior to procedure the correct patient, procedure, and site was verified    Site:  L Wrist        Medication Details:     Medications administered: 1 mL Triamcinolone 40 MG/ML; 1 mL lidocaine 1 %    Patient education: Changes in Pigmentation Discussed and Cortisone Flare Risk Discussed  Patient tolerance: patient tolerated the procedure well with no immediate complications        Imaging/Studies:      No imaging obtained      Assessment:     1. Left carpal tunnel syndrome        Plan:     We discussed different surgical and nonsurgical options at length today including other conservative management including anti-inflammatories, possible bracing options in therapy.  Given these options, we have elected to proceed with another cortisone injection.  This is detailed above.      Orders Placed This Encounter   . BMI Patient Education

## 2022-08-13 ENCOUNTER — Ambulatory Visit: Admit: 2022-08-13 | Discharge: 2022-08-13 | Payer: BLUE CROSS/BLUE SHIELD | Attending: Otolaryngology | Primary: Family

## 2022-08-13 ENCOUNTER — Encounter: Payer: BLUE CROSS/BLUE SHIELD | Attending: Otolaryngology | Primary: Family

## 2022-08-13 DIAGNOSIS — R053 Chronic cough: Secondary | ICD-10-CM

## 2022-08-13 NOTE — Progress Notes (Signed)
Otolaryngology-Head and Neck Surgery  Follow Up Patient Visit     Patient: Travis Weaver  Date of Birth: 04/30/1965  MRN: [id number]  Date of Service: 08/13/2022      Chief Complaint: Chronic cough     Interval hx 08/13/2022  No sig change despite 40 mg pantoprazole + carafate   Had barium swallow  Has GI appt coming up next week    Interval hx 07/18/2022  Weaned from gabapentin end of April    Did well until 1 mo ago when then developed worsening cough, dysphagia  A few episodes overnight, waking up and feeling as cannot breathe  Feeling reflux events into throat    Hx of esophageal dilation 10 + years ago  Prior endoscopy Dr Jearld Lesch 2years ago with findings of gastritis     Interval hx 04/2022  Doing very well as far as cough   On gabapentin TID and omeprazole 20 mg    Interval hx 07/25/2021  Had been doing better without cough for some months  Was able to wean prilosec to PRN  However was having a hard time remembering to take middle dose of gabapentin and did BID dosing instead  Then 2 weeks ago developed ? URI, and has had subsequent persistent, severe, dry cough    Interval hx 04/25/2021  Cough doing better  Weaned prilosec to 40 mg daily  On gabapentin 300 mg TID     Interval hx 01/26/2021  Doing better for the last month   On singulair and BID 40 mg prilosec     Interval hx 09/07/2020  Cough better in the last few weeks  Was given prednisone and cough suppressant via mychart - prednisone did not help    Cough seems to come and go however - periods where it can be better and then gets worse again    Currently on Pepcid, Prilosec, flonase    History of Present Illness: [name] is a 57 y.o. year old male who was last seen by Dr Sherlyn Lick for chronic cough a few months ago    Over the last 1 year, and developed chronic cough   Intermittent, largely dry  Very bothersome, worse at night     Has tried consistent zyrtec without relief  Has seen pulm - Dr Mitzi Hansen; had neg chest CT and pulm function testing   Was taken off  lisinopril   Takes pepcid 20 mg at baseline; saw Dr Sherlyn Lick - increased to 40 mg BID, without change     Denies post nasal drip, allergies, GERD concerns       Past Medical History:  Past Medical History:   Diagnosis Date    Contact dermatitis and eczema due to cause     Hypertension        Past Surgical History:   History reviewed. No pertinent surgical history.    Medications:   Current Outpatient Medications   Medication Instructions    albuterol (PROVENTIL HFA, VENTOLIN HFA, PROAIR HFA) 90 mcg/actuation inhaler 1 Puff, Inhalation, EVERY 6 HOURS AS NEEDED    benzonatate (TESSALON) 100 mg, Oral, 3 TIMES DAILY    dilTIAZem ER (CARDIZEM CD) 240 mg capsule TAKE 1 CAPSULE BY MOUTH EVERY DAY    fluticasone propionate (FLONASE) 50 mcg/actuation nasal spray SPRAY 2 SPRAYS INTO EACH NOSTRIL EVERY DAY    gabapentin (NEURONTIN) 100 mg capsule WEEK 1: 100 mg TID x 7 days. WEEK 2: 100 mg AM, afternoon, 200 mg PM. WEEK 3: 100 mg  AM, 200 mg afternoon, 200 mg PM.  WEEK 4. 200 mg TID.    hydrOXYzine pamoate (VISTARIL) 25 mg, Oral, 3 TIMES DAILY AS NEEDED    Linzess 290 mcg cap capsule TAKE 1 CAPSULE BY MOUTH EVERY DAY    omeprazole (PRILOSEC) 40 mg, Oral, DAILY       Allergies:   No Known Allergies    Social History:   Social History     Tobacco Use    Smoking status: Former     Packs/day: 1.00     Years: 35.00     Pack years: 35.00     Types: Cigarettes     Quit date: 2016     Years since quitting: 7.2    Smokeless tobacco: Never    Tobacco comments:     Patient vapes   Vaping Use    Vaping Use: Every day    Substances: Nicotine    Devices: Refillable tank   Substance Use Topics    Alcohol use: Never    Drug use: Never       Family History:  Family History   Problem Relation Age of Onset    OSTEOARTHRITIS Mother     Cancer Mother     Elevated Lipids Mother     Heart Disease Mother     Hypertension Mother     OSTEOARTHRITIS Father     Cancer Father     Elevated Lipids Father     Hypertension Father     Hypertension Sister      OSTEOARTHRITIS Brother        Review of Systems:  Consitutional: denies fever, excessive weight gain or loss.  Eyes: denies diplopia, eye pain.  Integumentary: denies new concerning skin lesions.  Ears, Nose, Mouth, Throat: denies except as per HPI.  Endocrine: denies hot or cold intolerance, increased thirst.  Respiratory: denies cough, hemoptysis, wheezing  Gastrointestinal: denies trouble swallowing, nausea, emesis, regurgitation  Musculoskeletal: denies muscle weakness or wasting  Cardiovascular: denies chest pain, shortness of breath  Neurologic: denies seizures, numbness or tingling, syncope  Hematologic: denies easy bleeding or bruising    Physical Examination:   BP (!) 160/90   Pulse 91   Resp 18   Ht 1.702 m (5\' 7" )   Wt 102.1 kg (225 lb)   SpO2 94%   BMI 35.24 kg/m         General: Comfortable, pleasant, appears stated age  Voice: Strong, speaking in full sentences, no stridor. Cough better today  Face: No masses or lesions, facial strength symmetric   Ears: External ears unremarkable. Bilateral ear canal clear. Tympanic membrane clear and intact, with visible landmarks. Clear middle ear space  Nose: External nose unremarkable. Dorsum midline. Anterior rhinoscopy demonstrates no lesions. Septum midline. Turbinates without hypertrophy.  Oral Cavity / Oropharynx: No trismus. Mucosa pink and moist. No lesions. Tongue is midline and mobile. Palate elevates symmetrically. Uvula midline. Tonsils unremarkable. Base of tongue soft. Floor of mouth soft.   Neck: Supple. No adenopathy. Thyroid unremarkable. Palpable laryngeal landmarks. Full neck range of motion   Neurologic: CN II - XI intact. Normal gait      Barium swallow   IMPRESSION:  1. Corkscrew morphology of the mid to distal esophagus during primary  peristalsis is compatible with esophageal spasms.  2. Esophageal dysmotility, with intraesophageal reflux above the level of the  thoracic outlet.  3. Delayed passage of the barium tablet across the area  of spasm      Assessment  and Plan:   Chronic cough   Dysphagia  - Scope exam suggestive of very significant GERD/LPR today   - Has stopped ACE inh, and we've tried consistent zyrtec + flonase and singulair in the past   - Chest CT negative and PFT negative  - GI endoscopy was negative 2 years ago - has not had objective reflux testing  - Ultimately we trialed gabapentin and he's been on 300 mg TID for over 1 year with good symptom relief  - He was able to discontinue this recently  - Cough may be related to cessation of gabapentin however his laryngeal exam and symptoms are suggestive of GERD/LPR  - Add pantoprazole 40 mg and carafate QHS  - Barium swallow with corkscrew esophagus, findings of GERD as well as esophageal dysmotility   - Discussed ultimately needs GI evaluation, may need EGD vs calcium channel blocker, other, follow up pending their plan       The patient was instructed to return to clinic if no improvement or progression of symptoms. Signs to watch out for reviewed.      Llana Aliment, MD   [facility]  [address] [facility] [address]  [place], [place] [address]  Office Phone: [phone]

## 2022-09-16 ENCOUNTER — Encounter

## 2022-09-17 ENCOUNTER — Encounter: Payer: BLUE CROSS/BLUE SHIELD | Attending: Otolaryngology | Primary: Family

## 2022-09-17 MED ORDER — PAROXETINE HCL 40 MG PO TABS
40 MG | ORAL_TABLET | Freq: Every day | ORAL | 1 refills | Status: DC
Start: 2022-09-17 — End: 2023-07-08

## 2022-09-17 NOTE — Telephone Encounter (Signed)
Hello ma'am is Dr. Hyacinth Meeker.  I am covering for Charlesetta Shanks this week if she has on vacation.  I have increased your paroxetine from 30 mg to 40 mg.  I sent it into [facility] on [address]    For weight loss, we will need an office visit as insurance requires preauthorization from many of the meds, and they will require an office note with a documented weight.

## 2022-10-11 ENCOUNTER — Telehealth: Admit: 2022-10-11 | Discharge: 2022-10-11 | Payer: BLUE CROSS/BLUE SHIELD | Attending: Family | Primary: Family

## 2022-10-11 DIAGNOSIS — F4312 Post-traumatic stress disorder, chronic: Secondary | ICD-10-CM

## 2022-10-11 MED ORDER — BREXPIPRAZOLE 0.5 MG PO TABS
0.5 | ORAL_TABLET | Freq: Every day | ORAL | 0 refills | Status: DC
Start: 2022-10-11 — End: 2022-10-30

## 2022-10-11 NOTE — Progress Notes (Signed)
 Chief Complaint   Patient presents with    Depression     Have you been to the ER, urgent care clinic since your last visit?  Hospitalized since your last visit?    NO    "Have you seen or consulted any other health care providers outside our system since your last visit?"    NO      Click Here for Release of Records Request

## 2022-10-11 NOTE — Progress Notes (Signed)
 Travis Weaver is a 57 y.o. male who was seen via telemedicine today 10/11/2022).    Assessment & Plan:   Below is the assessment and plan developed based on review of pertinent history, physical exam, labs, studies, and medications.    1. Chronic post-traumatic stress disorder (PTSD)  Comments:  not well controlled,  continue on paxil  and will add on adjunct therapy.  Orders:  -     brexpiprazole  (REXULTI ) 0.5 MG TABS tablet; Take 1 tablet by mouth daily, Disp-30 tablet, R-0Normal  2. Moderate episode of recurrent major depressive disorder (HCC)  Comments:  will add on rexulti  to help with anxiety/depression symptoms.  Orders:  -     brexpiprazole  (REXULTI ) 0.5 MG TABS tablet; Take 1 tablet by mouth daily, Disp-30 tablet, R-0Normal      Return for  , follow up as scheduled on 10/1.SABRA    Subjective:   [name] was seen today for Depression     Patient has been using paroxetine  for PTSD for the past 6 months, felt like he was doing well but in the recent months he has been having depression, dwelling on things, his wife has noticed that he is short with her and getting more agitated.   Patient has resorted to using alcohol two drinks at night to help with mood but he does not feel that this is reasonable to continue.    He would like help.       Previously tried wellbutrin  without improvement in symptoms.     Review of Systems   Psychiatric/Behavioral:  Positive for agitation and dysphoric mood. Negative for sleep disturbance and suicidal ideas. The patient is nervous/anxious.           Objective:     There were no vitals filed for this visit.   There is no height or weight on file to calculate BMI.     Physical Exam  Constitutional:       Appearance: Normal appearance.   HENT:      Head: Normocephalic.   Eyes:      Conjunctiva/sclera: Conjunctivae normal.   Pulmonary:      Effort: Pulmonary effort is normal.   Skin:     Coloration: Skin is not pale.   Neurological:      Mental Status: He is alert and oriented to person,  place, and time.   Psychiatric:         Mood and Affect: Mood is depressed. Affect is tearful.         Behavior: Behavior normal.          Allergies   Allergen Reactions    Latex Hives       Current Outpatient Medications   Medication Sig Dispense Refill    brexpiprazole  (REXULTI ) 0.5 MG TABS tablet Take 1 tablet by mouth daily 30 tablet 0    PARoxetine  (PAXIL ) 40 MG tablet Take 1 tablet by mouth daily 90 tablet 1    hydrOXYzine  HCl (ATARAX ) 25 MG tablet TAKE 1 TABLET BY MOUTH EVERY 8 HOURS AS NEEDED FOR ANXIETY 270 tablet 1    pantoprazole  (PROTONIX ) 40 MG tablet Take 1 tablet by mouth every morning (before breakfast) 90 tablet 1    sucralfate  (CARAFATE ) 1 GM tablet Take 1 tablet by mouth every evening 90 tablet 1    dilTIAZem  (CARDIZEM  CD) 240 MG extended release capsule Take 1 capsule by mouth daily 90 capsule 3    LINZESS  290 MCG CAPS capsule TAKE 1 CAPSULE BY  MOUTH EVERY DAY 90 capsule 1    clobetasol (TEMOVATE) 0.05 % external solution PLEASE SEE ATTACHED FOR DETAILED DIRECTIONS      ketoconazole (NIZORAL) 2 % cream APPLY ONCE A DAY BETWEEN BROWS AND SIDES OF NOSE.      Calcium Citrate-Vitamin D 250-5 MG-MCG TABS Take by mouth      Ascorbic Acid (VITAMIN C) 500 MG CAPS Take by mouth      clindamycin (CLEOCIN T) 1 % external solution APPLY TO THE BACK OF SCALP EVERY MORNING.      Magnesium 400 MG TABS Take by mouth      montelukast (SINGULAIR) 10 MG tablet Take 1 tablet by mouth nightly (Patient not taking: Reported on 07/18/2022)      lisinopril-hydroCHLOROthiazide (PRINZIDE;ZESTORETIC) 20-12.5 MG per tablet Take 1 tablet by mouth daily (Patient not taking: Reported on 07/18/2022)      levocetirizine (XYZAL) 5 MG tablet Take 1 tablet by mouth nightly (Patient not taking: Reported on 10/11/2022)      eszopiclone 3 MG TABS Take 1 tablet by mouth nightly. (Patient not taking: Reported on 10/11/2022)       No current facility-administered medications for this visit.       Reviewed and updated this visit:  Tobacco   Allergies  Meds  Problems  Med Hx  Surg Hx  Soc Hx  Fam Hx     MASOUD NYCE, was evaluated through a synchronous (real-time) audio-video encounter. The patient (or guardian if applicable) is aware that this is a billable service, which includes applicable co-pays. This Virtual Visit was conducted with patient's (and/or legal guardian's) consent. Patient identification was verified, and a caregiver was present when appropriate.   The patient was located at Home: [address]  [place] [address]  Provider was located at Home (Appt Dept State): [place]  Confirm you are appropriately licensed, registered, or certified to deliver care in the state where the patient is located as indicated above. If you are not or unsure, please re-schedule the visit: Yes, I confirm.        Services were provided through a video synchronous discussion virtually to substitute for in-person encounter.    --Gerri LITTIE Chang, APRN - NP on 10/11/2022 at 11:32 AM    An electronic signature was used to authenticate this note.

## 2022-10-30 ENCOUNTER — Ambulatory Visit: Admit: 2022-10-30 | Discharge: 2022-10-30 | Payer: BLUE CROSS/BLUE SHIELD | Attending: Family | Primary: Family

## 2022-10-30 DIAGNOSIS — F4312 Post-traumatic stress disorder, chronic: Secondary | ICD-10-CM

## 2022-10-30 MED ORDER — LINZESS 290 MCG PO CAPS
290 MCG | ORAL_CAPSULE | ORAL | 1 refills | Status: DC
Start: 2022-10-30 — End: 2023-07-08

## 2022-10-30 MED ORDER — WEGOVY 0.25 MG/0.5ML SC SOAJ
0.25 | SUBCUTANEOUS | 0 refills | Status: DC
Start: 2022-10-30 — End: 2022-11-23

## 2022-10-30 MED ORDER — DILTIAZEM HCL ER COATED BEADS 240 MG PO CP24
240 | ORAL_CAPSULE | Freq: Every day | ORAL | 3 refills | Status: DC
Start: 2022-10-30 — End: 2023-11-05

## 2022-10-30 MED ORDER — BREXPIPRAZOLE 1 MG PO TABS
1 | ORAL_TABLET | Freq: Every day | ORAL | 1 refills | Status: AC
Start: 2022-10-30 — End: ?

## 2022-10-30 NOTE — Telephone Encounter (Signed)
 Wegovy 0.25MG /0.5ML  Sent to [employer] waiting for decision

## 2022-10-30 NOTE — Progress Notes (Signed)
 Chief Complaint   Patient presents with    Follow-up     BP 131/86 (Site: Right Upper Arm, Position: Sitting)   Pulse 84   Temp 98.2 F (36.8 C) (Oral)   Resp 16   Ht 1.702 m (5' 7)   Wt 103 kg (227 lb)   SpO2 96%   BMI 35.55 kg/m     Have you been to the ER, urgent care clinic since your last visit?  Hospitalized since your last visit?    NO    "Have you seen or consulted any other health care providers outside our system since your last visit?"    NO

## 2022-10-30 NOTE — Progress Notes (Signed)
 Travis Weaver is a 57 y.o. male who was seen in clinic today (10/30/2022).    Assessment & Plan:   Below is the assessment and plan developed based on review of pertinent history, physical exam, labs, studies, and medications.    1. Chronic post-traumatic stress disorder (PTSD)  Comments:  referral to psychiatrist to help manage.   will increase rexulti  to 1 mg.  Orders:  -     External Referral To Psychiatry  -     brexpiprazole  (REXULTI ) 1 MG TABS tablet; Take 1 tablet by mouth daily, Disp-30 tablet, R-1Normal  -     TSH  -     Vitamin D 25 Hydroxy  2. Moderate episode of recurrent major depressive disorder (HCC)  -     External Referral To Psychiatry  -     brexpiprazole  (REXULTI ) 1 MG TABS tablet; Take 1 tablet by mouth daily, Disp-30 tablet, R-1Normal  -     Vitamin D 25 Hydroxy  3. Essential (primary) hypertension  Comments:  stable on current medication.   due for fasting labs  Orders:  -     dilTIAZem  (CARDIZEM  CD) 240 MG extended release capsule; Take 1 capsule by mouth daily, Disp-90 capsule, R-3Normal  -     CBC  -     Comprehensive Metabolic Panel  -     Lipid Panel  4. Irritable bowel syndrome with constipation  Comments:  uses linzess  with good control  Orders:  -     LINZESS  290 MCG CAPS capsule; TAKE 1 CAPSULE BY MOUTH EVERY DAY, Disp-90 capsule, R-1, DAWNormal  -     Vitamin D 25 Hydroxy  5. Class 2 severe obesity due to excess calories with serious comorbidity and body mass index (BMI) of 35.0 to 35.9 in adult  Comments:  will start on wegovy  in addition to increase exercise of 150 minutes and working on higher protein, lower starches and 1500 calorie diet  Orders:  -     Semaglutide -Weight Management (WEGOVY ) 0.25 MG/0.5ML SOAJ SC injection; Inject 0.25 mg into the skin every 7 days, Disp-2 mL, R-0Normal  -     CBC  -     Comprehensive Metabolic Panel  -     TSH  -     Vitamin D 25 Hydroxy  -     Lipid Panel  -     Hemoglobin A1C  6. Elevated glucose  Comments:  will monitor a1c  Orders:  -      Hemoglobin A1C      Return in about 6 months (around 04/30/2023) for Chronic OV.    Subjective:   Travis Weaver was seen today for Follow-up     Patient was seen for worsening PTSD over the past 6 months, was on paroxetine  and rexulti  was added x4 weeks ago.   Patient reported that he is still feeling the need to use alcohol in the evenings.   Patient reported that the helicopters are a trigger for him.     Patient reported that he is having tearing up episodes.     He did see a psychologist previously and dx with the PTSD.   Has not seen a psychologist or therapist recently.    Previously tried wellbutrin  without improvement in symptoms.   Also used zoloft  in the past.       Patient is following with Dr. Neysa who did barium swallow and had dilation of esophagus.   Is scheduled for further testing.  Continues  to have chronic cough.   Patient reported that foods are getting stuck.    Hypertension: Patients BP medications were changed by pulmonology to diltiazem .  Denies headaches, blurred vision or dizziness.       Constipation:  Stable on linzess .     Nutritional or dietetic assessment   Current dietary plan:  Working on reducing portions, has cut out sweets/processed foods.    Exercise: Active at work  Length of time dietary changes have been made: Greater than 6 months  Medication Management: None  Previous diets tried: nutrisystem which he was successful and was normal weight, zoom weight loss.     Last Weight Metrics:      10/30/2022    10:43 AM 08/13/2022     9:03 AM 07/18/2022    12:52 PM 05/22/2022     2:56 PM 07/25/2021     3:46 PM 04/25/2021     3:42 PM 01/26/2021     9:52 AM   Weight Loss Metrics   Height 5' 7 5' 7 5' 7 5' 7 5' 7 5' 7 5' 7   Weight - Scale 227 lbs 225 lbs 215 lbs 215 lbs 198 lbs 13 oz 192 lbs 192 lbs   BMI (Calculated) 35.6 kg/m2 35.3 kg/m2 33.7 kg/m2 33.7 kg/m2 31.2 kg/m2 30.1 kg/m2 30.1 kg/m2       Does the patient have a history or current eating disorder: No  Does the patient have hx of  malabsorption syndromes, cholestasis, pregnancy, and/or lactation?: No  Is the patient currently using a GLP1 like Victoza or Ozempic : No    Contraindications:  Phentermine is contraindicated due to HTN.   Contrave is contraindicated due to hx of side effects with wellbutrin .     Review of Systems   Constitutional:  Negative for fatigue.   Eyes:  Negative for visual disturbance.   Respiratory:  Negative for shortness of breath.    Cardiovascular:  Negative for chest pain, palpitations and leg swelling.   Neurological:  Negative for dizziness, weakness and headaches.   Psychiatric/Behavioral:  Negative for sleep disturbance. The patient is not nervous/anxious.           Objective:     Vitals:    10/30/22 1043   BP: 131/86   Site: Right Upper Arm   Position: Sitting   Pulse: 84   Resp: 16   Temp: 98.2 F (36.8 C)   TempSrc: Oral   SpO2: 96%   Weight: 103 kg (227 lb)   Height: 1.702 m (5' 7)      Body mass index is 35.55 kg/m.     Physical Exam  Constitutional:       Appearance: Normal appearance.   HENT:      Head: Normocephalic.   Eyes:      Conjunctiva/sclera: Conjunctivae normal.   Cardiovascular:      Rate and Rhythm: Normal rate and regular rhythm.   Pulmonary:      Effort: Pulmonary effort is normal.      Breath sounds: Normal breath sounds.   Musculoskeletal:      Cervical back: Normal range of motion.   Skin:     General: Skin is warm and dry.   Neurological:      Mental Status: He is alert and oriented to person, place, and time.   Psychiatric:         Mood and Affect: Mood normal.         Behavior: Behavior normal.  Allergies   Allergen Reactions    Latex Hives       Current Outpatient Medications   Medication Sig Dispense Refill    brexpiprazole  (REXULTI ) 1 MG TABS tablet Take 1 tablet by mouth daily 30 tablet 1    dilTIAZem  (CARDIZEM  CD) 240 MG extended release capsule Take 1 capsule by mouth daily 90 capsule 3    LINZESS  290 MCG CAPS capsule TAKE 1 CAPSULE BY MOUTH EVERY DAY 90 capsule 1     Semaglutide -Weight Management (WEGOVY ) 0.25 MG/0.5ML SOAJ SC injection Inject 0.25 mg into the skin every 7 days 2 mL 0    PARoxetine  (PAXIL ) 40 MG tablet Take 1 tablet by mouth daily 90 tablet 1    hydrOXYzine  HCl (ATARAX ) 25 MG tablet TAKE 1 TABLET BY MOUTH EVERY 8 HOURS AS NEEDED FOR ANXIETY 270 tablet 1    pantoprazole  (PROTONIX ) 40 MG tablet Take 1 tablet by mouth every morning (before breakfast) 90 tablet 1    sucralfate  (CARAFATE ) 1 GM tablet Take 1 tablet by mouth every evening 90 tablet 1    clobetasol (TEMOVATE) 0.05 % external solution PLEASE SEE ATTACHED FOR DETAILED DIRECTIONS      ketoconazole (NIZORAL) 2 % cream APPLY ONCE A DAY BETWEEN BROWS AND SIDES OF NOSE.      Calcium Citrate-Vitamin D 250-5 MG-MCG TABS Take by mouth      Ascorbic Acid (VITAMIN C) 500 MG CAPS Take by mouth      clindamycin (CLEOCIN T) 1 % external solution APPLY TO THE BACK OF SCALP EVERY MORNING.      Magnesium 400 MG TABS Take by mouth       No current facility-administered medications for this visit.       Reviewed and updated this visit:  Tobacco  Allergies  Meds  Problems  Med Hx  Surg Hx  Soc Hx  Fam Hx     Gerri LITTIE Chang, APRN - NP

## 2022-11-13 ENCOUNTER — Encounter: Payer: BLUE CROSS/BLUE SHIELD | Attending: Otolaryngology | Primary: Family

## 2022-11-23 ENCOUNTER — Telehealth

## 2022-11-23 MED ORDER — WEGOVY 0.5 MG/0.5ML SC SOAJ
0.5 MG/ML | SUBCUTANEOUS | 0 refills | Status: DC
Start: 2022-11-23 — End: 2022-12-21

## 2022-11-23 NOTE — Addendum Note (Signed)
Addended byCharlesetta Shanks on: 11/23/2022 10:57 AM     Modules accepted: Orders

## 2022-11-23 NOTE — Telephone Encounter (Signed)
0.5 mg dose sent to pharmacy

## 2022-11-23 NOTE — Telephone Encounter (Signed)
Requesting next dosage of Wegovy be sent to pharmacy.

## 2022-12-17 ENCOUNTER — Encounter: Payer: BLUE CROSS/BLUE SHIELD | Attending: Otolaryngology | Primary: Family

## 2022-12-20 ENCOUNTER — Encounter: Admit: 2022-12-20 | Admitting: Family

## 2022-12-20 DIAGNOSIS — E66812 Obesity, class 2: Secondary | ICD-10-CM

## 2022-12-20 DIAGNOSIS — Z6835 Body mass index (BMI) 35.0-35.9, adult: Secondary | ICD-10-CM

## 2022-12-21 MED ORDER — WEGOVY 1 MG/0.5ML SC SOAJ
1 MG/0.5ML | SUBCUTANEOUS | 0 refills | Status: DC
Start: 2022-12-21 — End: 2023-01-14

## 2022-12-21 NOTE — Telephone Encounter (Signed)
 Wegovy increased to 1 mg weekly and sent into [id number] - [place], [place] - [address]

## 2023-01-01 ENCOUNTER — Ambulatory Visit: Admit: 2023-01-01 | Payer: BLUE CROSS/BLUE SHIELD | Admitting: Pediatric Pulmonology | Primary: Family

## 2023-01-01 VITALS — BP 142/89 | HR 90 | Temp 98.60000°F | Ht 67.0 in | Wt 222.6 lb

## 2023-01-01 DIAGNOSIS — G4733 Obstructive sleep apnea (adult) (pediatric): Secondary | ICD-10-CM

## 2023-01-01 NOTE — Patient Instructions (Signed)
 [address] [place], [place] [address]  Tel.  [phone]  Fax. [phone] [address]  [place], [place] [address]  Tel.  [phone]  Fax. [phone] [address]  [place], [place] [address]  Tel.  [phone]

## 2023-01-01 NOTE — Progress Notes (Signed)
 [address] [name], [place] [address]  Tel.  [phone]    Fax. [phone]     [address]   [name], [place] [address]  Tel.  [phone]    Fax. [phone]

## 2023-01-07 ENCOUNTER — Encounter: Payer: BLUE CROSS/BLUE SHIELD | Attending: Otolaryngology | Primary: Family

## 2023-01-07 MED ORDER — PANTOPRAZOLE SODIUM 40 MG PO TBEC
40 | ORAL_TABLET | Freq: Every day | ORAL | 1 refills | Status: DC
Start: 2023-01-07 — End: 2023-08-05

## 2023-01-14 MED ORDER — WEGOVY 1.7 MG/0.75ML SC SOAJ
1.70.75 MG/0.75ML | SUBCUTANEOUS | 0 refills | Status: AC
Start: 2023-01-14 — End: 2023-02-12

## 2023-01-25 MED ORDER — SUCRALFATE 1 G PO TABS
1 GM | ORAL_TABLET | Freq: Every evening | ORAL | 1 refills | Status: DC
Start: 2023-01-25 — End: 2023-07-26

## 2023-02-12 ENCOUNTER — Encounter

## 2023-02-12 MED ORDER — SEMAGLUTIDE-WEIGHT MANAGEMENT 2.4 MG/0.75ML SC SOAJ
2.4 MG/0.75ML | SUBCUTANEOUS | 3 refills | Status: DC
Start: 2023-02-12 — End: 2023-05-12

## 2023-02-13 ENCOUNTER — Encounter

## 2023-02-13 MED ORDER — GABAPENTIN 300 MG PO CAPS
300 | ORAL_CAPSULE | Freq: Two times a day (BID) | ORAL | 0 refills | 21.00000 days | Status: DC
Start: 2023-02-13 — End: 2023-06-07

## 2023-02-14 ENCOUNTER — Ambulatory Visit: Admit: 2023-02-14 | Discharge: 2023-02-14 | Payer: BLUE CROSS/BLUE SHIELD | Primary: Family

## 2023-02-14 DIAGNOSIS — G4733 Obstructive sleep apnea (adult) (pediatric): Secondary | ICD-10-CM

## 2023-02-14 NOTE — Progress Notes (Signed)
[address] [place], [place] [address]  Tel.  [phone]  Fax. [phone] [address]  [place], [place] [address]  Tel.  [phone]  Fax. [phone] [address]  [place], [place] [address]  Tel.  [phone]  Fax. [phone]       Travis Weaver is seen today to receive a WatchPAT home sleep apnea testing (HSAT) device.    The WatchPAT HSAT Agreement was reviewed and endorsed by patient.  General information regarding operation of the HSAT device was provided.  Patient viewed the [facility] instructional video while in the clinic.  Patient was advised to contact patient support for any problems using the device.  Patient was advised to complete the Morning Questionnaire after awakening/ending the test.    There were no vitals taken for this visit.    Patient will return the HSAT device by noon unless otherwise approved.  Patient will be contacted with results once the study has been reviewed by the physician, typically within 15 business days.    WatchPAT Serial Number [id number]

## 2023-02-15 ENCOUNTER — Ambulatory Visit: Admit: 2023-02-15 | Discharge: 2023-02-15 | Payer: BLUE CROSS/BLUE SHIELD | Primary: Family

## 2023-02-15 ENCOUNTER — Inpatient Hospital Stay: Admit: 2023-02-20 | Payer: BLUE CROSS/BLUE SHIELD | Primary: Family

## 2023-02-15 DIAGNOSIS — G4733 Obstructive sleep apnea (adult) (pediatric): Secondary | ICD-10-CM

## 2023-02-15 NOTE — Progress Notes (Signed)
Patient returned the [facility] home sleep apnea test (HSAT) device intact and without evident damage. Patient will receive the results of the test within 15 business days.

## 2023-02-25 ENCOUNTER — Ambulatory Visit

## 2023-02-25 NOTE — Progress Notes (Signed)
Sleep study results relayed to patient via MyChart.

## 2023-02-25 NOTE — Progress Notes (Signed)
Travis Weaver is to be contacted by sleep technologists regarding results of WatchPAT Testing which was indicative of an average pAHI 3% of 22.8 and pAHI 4% of 5.2 per hour.  SpO2 nadir was 88% and SpO2 of < 88% was 0.0 minutes.      An APAP prescription has been written and patient will be contacted by office staff regarding follow-up  in 2-3 months after initiation of therapy.    Encounter Diagnosis   Name Primary?    OSA (obstructive sleep apnea) Yes       Orders Placed This Encounter   Procedures    DME Order for (Specify) as OP     - DME device ordered - ResMed Device with Heated Humidifer [id number] / [phone].   - Diagnosis: Obstructive Sleep Apnea (G47.33)  - Length of Need: Lifetime    Pressure Setting: 06 - 15 cmH2O    A7032 Nasal Cushion (Replace) 2 per month.    U9811 Nasal Interface Mask 1 every 3 months.   B1478 Headgear 1 every 6 months.    G9562 Filter(s) Disposable 2 per month.  Z3086 Filter(s) Non-Disposable 1 every 6 months.     V7846 Water Chamber for [employer] (Replace) 1 every 6 months.  N6295 Tubing with heating element 1 every 3 months.    Perform Mask Fitting per patient preference and comfort - replace as above.      Romana Juniper, MD, FAASM; NPI: [id number]  Electronically signed. 02/25/2023

## 2023-02-26 ENCOUNTER — Ambulatory Visit: Admit: 2023-02-26 | Discharge: 2023-02-26 | Payer: BLUE CROSS/BLUE SHIELD | Attending: Otolaryngology | Primary: Family

## 2023-02-26 VITALS — BP 126/82 | HR 71 | Resp 16 | Ht 67.0 in

## 2023-02-26 DIAGNOSIS — R053 Chronic cough: Secondary | ICD-10-CM

## 2023-02-26 NOTE — Progress Notes (Signed)
PAP order faxed to dme company and patient informed via mychart. Patient also scheduled for first adherence appointment

## 2023-02-27 NOTE — Progress Notes (Signed)
Otolaryngology-Head and Neck Surgery  Follow Up Patient Visit     Patient: Travis Weaver  Date of Birth: Aug 13, 1965  MRN: [id number]  Date of Service: 02/26/2023      Chief Complaint: Chronic cough     Interval hx 02/26/2023  Had EGD with dilation, findings consistent with esophageal dysmotility, stricture, GERD   Doing better   No issues as far as cough goes, using gabapentin 300 mg BID     Interval hx 07/2022  No sig change despite 40 mg pantoprazole + carafate   Had barium swallow  Has GI appt coming up next week    Interval hx 07/18/2022  Weaned from gabapentin end of April    Did well until 1 mo ago when then developed worsening cough, dysphagia  A few episodes overnight, waking up and feeling as cannot breathe  Feeling reflux events into throat    Hx of esophageal dilation 10 + years ago  Prior endoscopy Dr Travis Weaver 2years ago with findings of gastritis     Interval hx 04/2022  Doing very well as far as cough   On gabapentin TID and omeprazole 20 mg    Interval hx 07/25/2021  Had been doing better without cough for some months  Was able to wean prilosec to PRN  However was having a hard time remembering to take middle dose of gabapentin and did BID dosing instead  Then 2 weeks ago developed ? URI, and has had subsequent persistent, severe, dry cough    Interval hx 04/25/2021  Cough doing better  Weaned prilosec to 40 mg daily  On gabapentin 300 mg TID     Interval hx 01/26/2021  Doing better for the last month   On singulair and BID 40 mg prilosec     Interval hx 09/07/2020  Cough better in the last few weeks  Was given prednisone and cough suppressant via mychart - prednisone did not help    Cough seems to come and go however - periods where it can be better and then gets worse again    Currently on Pepcid, Prilosec, flonase    History of Present Illness: Travis Weaver is a 58 y.o. year old male who was last seen by Dr Sherlyn Lick for chronic cough a few months ago    Over the last 1 year, and developed chronic cough    Intermittent, largely dry  Very bothersome, worse at night     Has tried consistent zyrtec without relief  Has seen pulm - Dr Mitzi Hansen; had neg chest CT and pulm function testing   Was taken off lisinopril   Takes pepcid 20 mg at baseline; saw Dr Sherlyn Lick - increased to 40 mg BID, without change     Denies post nasal drip, allergies, GERD concerns       Past Medical History:  Past Medical History:   Diagnosis Date    Contact dermatitis and eczema due to cause     Hypertension        Past Surgical History:   History reviewed. No pertinent surgical history.    Medications:   Current Outpatient Medications   Medication Instructions    albuterol (PROVENTIL HFA, VENTOLIN HFA, PROAIR HFA) 90 mcg/actuation inhaler 1 Puff, Inhalation, EVERY 6 HOURS AS NEEDED    benzonatate (TESSALON) 100 mg, Oral, 3 TIMES DAILY    dilTIAZem ER (CARDIZEM CD) 240 mg capsule TAKE 1 CAPSULE BY MOUTH EVERY DAY    fluticasone propionate (FLONASE) 50 mcg/actuation nasal spray  SPRAY 2 SPRAYS INTO EACH NOSTRIL EVERY DAY    gabapentin (NEURONTIN) 100 mg capsule WEEK 1: 100 mg TID x 7 days. WEEK 2: 100 mg AM, afternoon, 200 mg PM. WEEK 3: 100 mg AM, 200 mg afternoon, 200 mg PM.  WEEK 4. 200 mg TID.    hydrOXYzine pamoate (VISTARIL) 25 mg, Oral, 3 TIMES DAILY AS NEEDED    Linzess 290 mcg cap capsule TAKE 1 CAPSULE BY MOUTH EVERY DAY    omeprazole (PRILOSEC) 40 mg, Oral, DAILY       Allergies:   No Known Allergies    Social History:   Social History     Tobacco Use    Smoking status: Former     Packs/day: 1.00     Years: 35.00     Pack years: 35.00     Types: Cigarettes     Quit date: 2016     Years since quitting: 7.2    Smokeless tobacco: Never    Tobacco comments:     Patient vapes   Vaping Use    Vaping Use: Every day    Substances: Nicotine    Devices: Refillable tank   Substance Use Topics    Alcohol use: Never    Drug use: Never       Family History:  Family History   Problem Relation Age of Onset    OSTEOARTHRITIS Mother     Cancer Mother      Elevated Lipids Mother     Heart Disease Mother     Hypertension Mother     OSTEOARTHRITIS Father     Cancer Father     Elevated Lipids Father     Hypertension Father     Hypertension Sister     OSTEOARTHRITIS Brother        Review of Systems:  Consitutional: denies fever, excessive weight gain or loss.  Eyes: denies diplopia, eye pain.  Integumentary: denies new concerning skin lesions.  Ears, Nose, Mouth, Throat: denies except as per HPI.  Endocrine: denies hot or cold intolerance, increased thirst.  Respiratory: denies cough, hemoptysis, wheezing  Gastrointestinal: denies trouble swallowing, nausea, emesis, regurgitation  Musculoskeletal: denies muscle weakness or wasting  Cardiovascular: denies chest pain, shortness of breath  Neurologic: denies seizures, numbness or tingling, syncope  Hematologic: denies easy bleeding or bruising    Physical Examination:   BP 126/82 (Site: Left Upper Arm, Position: Sitting, Cuff Size: Large Adult)   Pulse 71   Resp 16   Ht 1.702 m (5\' 7" )   SpO2 97%   BMI 34.86 kg/m         General: Comfortable, pleasant, appears stated age  Voice: Strong, speaking in full sentences, no stridor. No cough during exam today  Face: No masses or lesions, facial strength symmetric   Ears: External ears unremarkable. Bilateral ear canal clear. Tympanic membrane clear and intact, with visible landmarks. Clear middle ear space  Nose: External nose unremarkable. Dorsum midline. Anterior rhinoscopy demonstrates no lesions. Septum midline. Turbinates without hypertrophy.  Oral Cavity / Oropharynx: No trismus. Mucosa pink and moist. No lesions. Tongue is midline and mobile. Palate elevates symmetrically. Uvula midline. Tonsils unremarkable. Base of tongue soft. Floor of mouth soft.   Neck: Supple. No adenopathy. Thyroid unremarkable. Palpable laryngeal landmarks. Full neck range of motion   Neurologic: CN II - XI intact. Normal gait      Barium swallow   IMPRESSION:  1. Corkscrew morphology of the  mid to distal esophagus during primary  peristalsis is compatible with esophageal spasms.  2. Esophageal dysmotility, with intraesophageal reflux above the level of the  thoracic outlet.  3. Delayed passage of the barium tablet across the area of spasm      Assessment and Plan:   Chronic cough   Dysphagia  - Scope exam suggestive of very significant GERD/LPR previously   - Has stopped ACE inh, and we've tried consistent zyrtec + flonase and singulair in the past   - Chest CT negative and PFT negative  - Ultimately we trialed gabapentin and he's been on 300 mg TID for over 1 year with good symptom relief, now weaned to BID   - Barium swallow with corkscrew esophagus, findings of GERD as well as esophageal dysmotility   - He has reestablished with GI recently and has had better GERD control  - Discussed ultimately stopping gabapentin but for now we have decided to continue with BID dosing  - Refills for both gabapentin and pantoprazole previously sent in       The patient was instructed to return to clinic if no improvement or progression of symptoms. Signs to watch out for reviewed.      Llana Aliment, MD   [facility]  [address] [facility] [address]  [place], [place] [address]  Office Phone: [phone]

## 2023-04-10 ENCOUNTER — Ambulatory Visit: Admit: 2023-04-10 | Discharge: 2023-04-10 | Payer: BLUE CROSS/BLUE SHIELD | Attending: Family | Primary: Family

## 2023-04-10 MED ORDER — TIRZEPATIDE-WEIGHT MANAGEMENT 7.5 MG/0.5ML SC SOAJ
7.5 MG/0.5ML | SUBCUTANEOUS | 0 refills | Status: DC
Start: 2023-04-10 — End: 2023-05-12

## 2023-04-10 NOTE — Assessment & Plan Note (Signed)
 Chronic, at goal (stable), continue current treatment plan

## 2023-04-10 NOTE — Progress Notes (Signed)
 Travis Weaver is a 58 y.o. male who was seen in clinic today (04/10/2023).    Assessment & Plan:   Below is the assessment and plan developed based on review of pertinent history, physical exam, labs, studies, and medications.    1. Class 2 severe obesity due to excess calories with serious comorbidity and body mass index (BMI) of 35.0 to 35.9 in adult  Comments:  will try switching to zepbound to help with continued weight loss.  Orders:  -     tirzepatide-weight management (ZEPBOUND) 7.5 MG/0.5ML SOAJ subCUTAneous auto-injector pen; Inject 7.5 mg into the skin every 7 days, Disp-2 mL, R-0Normal  -     CBC  -     Comprehensive Metabolic Panel  -     Lipid Panel  2. Moderate episode of recurrent major depressive disorder [facility])  Assessment & Plan:   Chronic, at goal (stable), continue current treatment plan  3. Simple chronic bronchitis (HCC)  Assessment & Plan:   Chronic, not at goal (unstable), continue current treatment plan  4. Encounter for dietary counseling and surveillance  Comments:  encouraged high protein/fiber in the diet.  try to limit calories to 1800 a day  5. Encounter for exercise counseling  Comments:  recommended increasing strength training and cardio  6. Screening for prostate cancer  -     PSA Screening      No follow-ups on file.    Subjective:   Travis Weaver was seen today for Follow-up     Obesity: Patient reported that he has been using wegovy but he still feels constantly hungry.  Patient notes that he has been making changes to his diet and feels that his changes and portion control are the only reason he has lost weight.  He does not feel that the wegovy is helping.     Nutritional or dietetic assessment   Current dietary plan: eating 3 meals a day.   Patient has healthy snacks.  Limiting portions.     Exercise: stays active at work.  Walks the dogs  Length of time dietary changes have been made: Greater than 6 months  Medication Management: currently on wegovy and has had some weight loss but is  hitting plateau.       Last Weight Metrics:      04/10/2023     7:07 AM 02/26/2023     3:49 PM 01/01/2023     3:59 PM 10/30/2022    10:43 AM 08/13/2022     9:03 AM 07/18/2022    12:52 PM 05/22/2022     2:56 PM   Weight Loss Metrics   Height 5\' 7"  5\' 7"  5\' 7"  5\' 7"  5\' 7"  5\' 7"  5\' 7"    Weight - Scale 198 lbs  222 lbs 10 oz 227 lbs 225 lbs 215 lbs 215 lbs   BMI (Calculated) 31.1 kg/m2  34.9 kg/m2 35.6 kg/m2 35.3 kg/m2 33.7 kg/m2 33.7 kg/m2        Does the patient have a history or current eating disorder: No  Does the patient have hx of malabsorption syndromes, cholestasis, pregnancy, and/or lactation?: No  Is the patient currently using a GLP1 like Victoza or Ozempic: No      Review of Systems   Constitutional:  Negative for fatigue.   Eyes:  Negative for visual disturbance.   Respiratory:  Negative for shortness of breath.    Cardiovascular:  Negative for chest pain, palpitations and leg swelling.   Neurological:  Negative for dizziness,  weakness and headaches.   Psychiatric/Behavioral:  Negative for sleep disturbance. The patient is not nervous/anxious.           Objective:     Vitals:    04/10/23 0707   BP: 117/76   BP Site: Right Upper Arm   Patient Position: Sitting   Pulse: 77   Resp: 16   Temp: 98 F (36.7 C)   TempSrc: Oral   SpO2: 98%   Weight: 89.8 kg (198 lb)   Height: 1.702 m (5\' 7" )      Body mass index is 31.01 kg/m.     Physical Exam  Constitutional:       Appearance: Normal appearance.   HENT:      Head: Normocephalic.   Eyes:      Conjunctiva/sclera: Conjunctivae normal.   Cardiovascular:      Rate and Rhythm: Normal rate and regular rhythm.   Pulmonary:      Effort: Pulmonary effort is normal.      Breath sounds: Normal breath sounds.   Musculoskeletal:      Cervical back: Normal range of motion.   Skin:     General: Skin is warm and dry.   Neurological:      Mental Status: He is alert and oriented to person, place, and time.   Psychiatric:         Mood and Affect: Mood normal.         Behavior: Behavior  normal.          Allergies   Allergen Reactions    Latex Hives       Current Outpatient Medications   Medication Sig Dispense Refill    tirzepatide-weight management (ZEPBOUND) 7.5 MG/0.5ML SOAJ subCUTAneous auto-injector pen Inject 7.5 mg into the skin every 7 days 2 mL 0    busPIRone (BUSPAR) 7.5 MG tablet TAKE 1 TABLET BY MOUTH TWICE A DAY AS DIRECTED      traZODone (DESYREL) 150 MG tablet       Venlafaxine Besylate ER 112.5 MG TB24 Take 1 tablet by mouth daily      gabapentin (NEURONTIN) 300 MG capsule Take 1 capsule by mouth 2 times daily for 180 doses. Intended supply: 90 days Max Daily Amount: 600 mg 180 capsule 0    Semaglutide-Weight Management (WEGOVY) 2.4 MG/0.75ML SOAJ SC injection Inject 2.4 mg into the skin every 7 days 3 mL 3    sucralfate (CARAFATE) 1 GM tablet TAKE 1 TABLET BY MOUTH EVERY DAY IN THE EVENING 90 tablet 1    pantoprazole (PROTONIX) 40 MG tablet Take 1 tablet by mouth every morning (before breakfast) 90 tablet 1    brexpiprazole (REXULTI) 1 MG TABS tablet Take 1 tablet by mouth daily 30 tablet 1    dilTIAZem (CARDIZEM CD) 240 MG extended release capsule Take 1 capsule by mouth daily 90 capsule 3    LINZESS 290 MCG CAPS capsule TAKE 1 CAPSULE BY MOUTH EVERY DAY 90 capsule 1    hydrOXYzine HCl (ATARAX) 25 MG tablet TAKE 1 TABLET BY MOUTH EVERY 8 HOURS AS NEEDED FOR ANXIETY 270 tablet 1    clobetasol (TEMOVATE) 0.05 % external solution PLEASE SEE ATTACHED FOR DETAILED DIRECTIONS      ketoconazole (NIZORAL) 2 % cream APPLY ONCE A DAY BETWEEN BROWS AND SIDES OF NOSE.      Calcium Citrate-Vitamin D 250-5 MG-MCG TABS Take by mouth      Ascorbic Acid (VITAMIN C) 500 MG CAPS Take by mouth  clindamycin (CLEOCIN T) 1 % external solution APPLY TO THE BACK OF SCALP EVERY MORNING.      Magnesium 400 MG TABS Take by mouth      PARoxetine (PAXIL) 40 MG tablet Take 1 tablet by mouth daily 90 tablet 1     No current facility-administered medications for this visit.       Reviewed and updated this  visit:  Tobacco  Allergies  Meds  Problems  Med Hx  Surg Hx  Soc Hx  Fam Hx     Court Joy, APRN - NP

## 2023-04-10 NOTE — Assessment & Plan Note (Signed)
 Chronic, not at goal (unstable), continue current treatment plan

## 2023-04-10 NOTE — Progress Notes (Signed)
 Chief Complaint   Patient presents with    Follow-up     BP 117/76 (BP Site: Right Upper Arm, Patient Position: Sitting)   Pulse 77   Temp 98 F (36.7 C) (Oral)   Resp 16   Wt 89.8 kg (198 lb)   SpO2 98%   BMI 31.01 kg/m     "Have you been to the ER, urgent care clinic since your last visit?  Hospitalized since your last visit?"    YES - When: approximately 10 days ago.  Where and Why: [facility].    "Have you seen or consulted any other health care providers outside our system since your last visit?"    YES - When: approximately 10 days ago.  Where and Why: YES - When: approximately 10 days ago.  Where and Why: [facility] Cadden Elizondo.Marland Kitchen

## 2023-04-11 LAB — LIPID PANEL
Cholesterol, Total: 182 mg/dL (ref 100–199)
HDL: 46 mg/dL (ref >39)
LDL Cholesterol: 110 mg/dL — ABNORMAL HIGH (ref 0–99)
Triglycerides: 144 mg/dL (ref 0–149)
VLDL Cholesterol Calculated: 26 mg/dL (ref 5–40)

## 2023-04-11 LAB — CBC
Hematocrit: 42.7 % (ref 37.5–51.0)
Hemoglobin: 14.1 g/dL (ref 13.0–17.7)
MCH: 31.9 pg (ref 26.6–33.0)
MCHC: 33 g/dL (ref 31.5–35.7)
MCV: 97 fL (ref 79–97)
Platelets: 299 10*3/uL (ref 150–450)
RBC: 4.42 x10E6/uL (ref 4.14–5.80)
RDW: 12.9 % (ref 11.6–15.4)
WBC: 6.4 10*3/uL (ref 3.4–10.8)

## 2023-04-11 LAB — COMPREHENSIVE METABOLIC PANEL
ALT: 16 IU/L (ref 0–44)
AST: 15 IU/L (ref 0–40)
Albumin: 4.6 g/dL (ref 3.8–4.9)
Alkaline Phosphatase: 27 IU/L — ABNORMAL LOW (ref 44–121)
BUN/Creatinine Ratio: 20 (ref 9–20)
BUN: 20 mg/dL (ref 6–24)
CO2: 24 mmol/L (ref 20–29)
Calcium: 10 mg/dL (ref 8.7–10.2)
Chloride: 104 mmol/L (ref 96–106)
Creatinine: 1.01 mg/dL (ref 0.76–1.27)
Est, Glom Filt Rate: 86 mL/min/{1.73_m2} (ref >59)
Globulin, Total: 2.2 g/dL (ref 1.5–4.5)
Glucose: 83 mg/dL (ref 70–99)
Potassium: 4.3 mmol/L (ref 3.5–5.2)
Sodium: 141 mmol/L (ref 134–144)
Total Bilirubin: 0.4 mg/dL (ref 0.0–1.2)
Total Protein: 6.8 g/dL (ref 6.0–8.5)

## 2023-04-11 LAB — PSA SCREENING: PSA: 0.9 ng/mL (ref 0.0–4.0)

## 2023-05-12 ENCOUNTER — Encounter

## 2023-05-12 MED ORDER — TIRZEPATIDE-WEIGHT MANAGEMENT 10 MG/0.5ML SC SOAJ
10 MG/0.5ML | SUBCUTANEOUS | 0 refills | Status: DC
Start: 2023-05-12 — End: 2023-06-10

## 2023-05-29 ENCOUNTER — Telehealth: Admit: 2023-05-29 | Discharge: 2023-05-29 | Payer: BLUE CROSS/BLUE SHIELD | Attending: Family | Primary: Family

## 2023-05-29 DIAGNOSIS — G4733 Obstructive sleep apnea (adult) (pediatric): Secondary | ICD-10-CM

## 2023-05-29 NOTE — Patient Instructions (Signed)
 [address] [place], [place] [address]  Tel.  [phone]  Fax. [phone] [address]  [place], [place] [address]  Tel.  [phone]  Fax. [phone] [address]  [place], [place] [address]  Tel.  [phone]  Fax. [phone]     Learning About CPAP for Sleep Apnea  What is CPAP?              CPAP is a small machine that you use at home every night while you sleep. It increases air pressure in your throat to keep your airway open. When you have sleep apnea, this can help you sleep better so you feel much better. CPAP stands for "continuous positive airway pressure."  The CPAP machine will have one of the following:  A mask that covers your nose and mouth  Prongs that fit into your nose  A mask that covers your nose only, the most common type. This type is called NCPAP. The N stands for "nasal."  Why is it done?  CPAP is usually the best treatment for obstructive sleep apnea. It is the first treatment choice and the most widely used. Your doctor may suggest CPAP if you have:  Moderate to severe sleep apnea.  Sleep apnea and coronary artery disease (CAD) or heart failure.  How does it help?  CPAP can help you have more normal sleep, so you feel less sleepy and more alert during the daytime.  CPAP may help keep heart failure or other heart problems from getting worse.  NCPAP may help lower your blood pressure.  If you use CPAP, your bed partner may also sleep better because you are not snoring or restless.  What are the side effects?  Some people who use CPAP have:  A dry or stuffy nose and a sore throat.  Irritated skin on the face.  Sore eyes.  Bloating.  If you have any of these problems, work with your doctor to fix them. Here are some things you can try:  Be sure the mask or nasal prongs fit well.  See if your doctor can adjust the pressure of your CPAP.  If your nose is dry, try a humidifier.  If your nose is runny or stuffy, try decongestant medicine or a steroid  nasal spray.  If these things do not help, you might try a different type of machine. Some machines have air pressure that adjusts on its own. Others have air pressures that are different when you breathe in than when you breathe out. This may reduce discomfort caused by too much pressure in your nose.               Where can you learn more?   Go to [email or web address]  Enter [phone] in the search box to learn more about "Learning About CPAP for Sleep Apnea."    2006-2011 [employer]. Care instructions adapted under license by [employer] (which disclaims liability or warranty for this information). This care instruction is for use with your licensed healthcare professional. If you have questions about a medical condition or this instruction, always ask your healthcare professional. Healthwise, Incorporated disclaims any warranty or liability for your use of this information.  Content Version: 8.9.83828; Last Revised: February 09, 2008  PROPER SLEEP HYGIENE    What to avoid  Do not have drinks with caffeine, such as coffee or black tea, for 8 hours before bed.  Do not smoke or use other types of tobacco near bedtime. Nicotine  is a stimulant and can keep you awake.  Avoid drinking alcohol late in the evening, because it can cause you to wake in the middle of the night.  Do not eat a big meal close to bedtime. If you are hungry, eat a light snack.  Do not drink a lot of water close to bedtime, because the need to urinate may wake you up during the night.  Do not read or watch TV in bed. Use the bed only for sleeping and sexual activity.  What to try  Go to bed at the same time every night, and wake up at the same time every morning. Do not take naps during the day.  Keep your bedroom quiet, dark, and cool.  Get regular exercise, but not within 3 to 4 hours of your bedtime..  Sleep on a comfortable pillow and mattress.  If watching the clock makes you anxious, turn it facing away from you so  you cannot see the time.  If you worry when you lie down, start a worry book. Well before bedtime, write down your worries, and then set the book and your concerns aside.  Try meditation or other relaxation techniques before you go to bed.  If you cannot fall asleep, get up and go to another room until you feel sleepy. Do something relaxing. Repeat your bedtime routine before you go to bed again.  Make your house quiet and calm about an hour before bedtime. Turn down the lights, turn off the TV, log off the computer, and turn down the volume on music. This can help you relax after a busy day.    Drowsy Driving: The [employer] cites drowsiness as a causing factor in more than 100,000 police reported crashes annually, resulting in 76,000 injuries and 1,500 deaths. Other surveys suggest 55% of people polled have driven while drowsy in the past year, 23% had fallen asleep but not crashed, 3% crashed, and 2% had and accident due to drowsy driving.  Who is at risk?   Young Drivers: One study of drowsy driving accidents states that 55% of the drivers were under 25 years. Of those, 75% were male.   Shift Workers and Travelers: People who work overnight or travel across time zones frequently are at higher risk of experiencing Circadian Rhythm Disorders. They are trying to work and function when their body is programed to sleep.   Sleep Deprived: Lack of sleep has a serious impact on your ability to pay attention or focus on a task. Consistently getting less than the average of 8 hours your body needs creates partial or cumulative sleep deprivation.   Untreated Sleep Disorders: Sleep Apnea, Narcolepsy, R.L.S., and other sleep disorders (untreated) prevent a person from getting enough restful sleep. This leads to excessive daytime sleepiness and increases the risk for drowsy driving accidents by up to 7 times.  Medications / Alcohol: Even over the counter medications can cause  drowsiness. Medications that impair a drivers attention should have a warning label. Alcohol naturally makes you sleepy and on its own can cause accidents. Combined with excessive drowsiness its effects are amplified.   Signs of Drowsy Driving:   * You don't remember driving the last few miles   * You may drift out of your lane   * You are unable to focus and your thoughts wander   * You may yawn more often than normal   * You have difficulty keeping your eyes open / nodding  off   * Missing traffic signs, speeding, or tailgating  Prevention-   Good sleep hygiene, lifestyle and behavioral choices have the most impact on drowsy driving. There is no substitute for sleep and the average person requires 8 hours nightly. If you find yourself driving drowsy, stop and sleep. Consider the sleep hygiene tips provided during your visit as well.     Medication Refill Policy: Refills for all medications require 1 week advance notice. Please have your pharmacy fax a refill request. We are unable to fax, or call in "controled substance" medications and you will need to pick these prescriptions up from our office.

## 2023-05-29 NOTE — Progress Notes (Signed)
 PAP supply order sent to DME company

## 2023-05-29 NOTE — Progress Notes (Signed)
 [address] [place], [place] [address]   Tel.  [phone]   Fax. [phone]  [address]   [place], [place] [address]   Tel.  [phone]   Fax. [phone]  [address]   [place], [place] [address]   Tel.  [phone]   Fax. [phone]     Travis Weaver (DOB: January 05, 1966) is a 58 y.o. male, established patient, seen for positive airway pressure follow-up, he was last seen by Dr. Janyth Meres on 12/2022, prior notes reviewed in detail.  Home sleep test 01/2023 showed pAHI 3% of 22.8 and pAHI 4% of 5.2 per hour.  SpO2 nadir was 88% and SpO2 of < 88% was 0.0 minutes. He is seen today for follow up.     ASSESSMENT/PLAN:   Diagnosis Orders   1. OSA (obstructive sleep apnea)  DME Order for [facility]) as OP      2. Chronic post-traumatic stress disorder (PTSD)        3. Anxiety and depression        4. BMI 31.0-31.9,adult          AHI = 22/5 (2024).  On CPAP, Resmed :  6-15 cmH2O. Set up 03/19/2023.    He is adherent with PAP therapy and PAP continues to benefit patient and remains necessary for control of his sleep apnea.     No follow-up provider specified.    Sleep Apnea -  Continue on current pressures. He is still feeling daytime fatigue but relates it to his medications he is taking for PTSD.     Orders Placed This Encounter   Procedures    DME Order for (Specify) as OP     Diagnosis: (G47.33) OSA (obstructive sleep apnea)  (primary encounter diagnosis)     Replacement Supplies for Positive Airway Pressure Therapy Device:   Duration of need: 99 months.     E3329 Nasal Pillows Combo Mask (Replace) 2 per month.  J1884 Nasal Pillows (Replace) 2 per month.   Z6606 Nasal Cushion (Replace) 2 per month.  T0160 Nasal Interface Mask 1 every 3 months.        F0932 Headgear 1 every 6 months.  T5573 Positive Airway Pressure chinstrap 1 every 6 months.    U2025 Tubing with heating element 1 every 3 months.    K2706 Filter(s) Disposable 2 per month.  C3762 Filter(s) Non-Disposable 1 every 6  months.   .   G3151 Water Chamber for Humidifier (Replace) 1 every 6 months.    Louellen Route, FNP-BC NPI: [id number]    Electronically signed. Date:- 05/29/23     *  Counseling was provided regarding the importance of regular PAP use with emphasis on ensuring sufficient total sleep time, proper sleep hygiene, and safe driving.    * Re-enforced proper and regular cleaning for the device.    * He was asked to contact our office for any problems regarding PAP therapy.    2. PTSD     3. Anxiety/depression     4. Recommended a dedicated weight loss program through appropriate diet and exercise regimen as significant weight reduction has been shown to reduce severity of obstructive sleep apnea.     SUBJECTIVE/OBJECTIVE:    He  is seen today for follow up on PAP device and reports no problems using the device.   The following concerns reviewed:    Drowsiness Yes - he feels related to other health conditions  Problems exhaling no   Snoring no Forget  to put on no   Mask Comfortable yes Can't fall asleep no   Dry Mouth no Mask falls off no   Air Leaking no Frequent awakenings no     He admits that his sleep has improved on PAP therapy using nasal mask and heated tubing.     Review of device download indicated:  Auto pressure: 6-15 cmH2O; Max Pressure: 10.7 cmH2O; 95th Percentile Pressure: 9.2 cmH2O  95th Percentile Leak: 23.8 L/min  % Used Days >= 4 hours: 87.  Avg hours used:  8 hours.    Therapy Apnea Index averaged over PAP use: 0.7 /hr which reflects improved sleep breathing condition.        05/28/2023    10:29 AM 12/31/2022    12:19 PM   Sleep Medicine   Sitting and reading 2 2   Watching TV 1 2   Sitting, inactive in a public place (e.g. a theatre or a meeting) 1 1   As a passenger in a car for an hour without a break 3 3   Lying down to rest in the afternoon when circumstances permit 3 3   Sitting and talking to someone 0 0   Sitting quietly after a lunch without alcohol 2 2   In a car, while stopped for a few minutes  in traffic 1 2   Epworth Sleepiness Score 13  15    Neck (Inches)  17.75       Patient-reported    which reflects improved sleep quality over therapy time.    Sleep Review of Systems: notable for Negative difficulty falling asleep; Negative  awakenings at night; Negative early morning headaches; Negative memory problems; Negative concentration issues; Negative chest pain; Negative shortness of breath; Negative significant joint pain at night; Negative significant muscle pain at night; Negative rashes or itching; Negative heartburn; Negative significant mood issues    Vitals reported by patient        No data to display               Calculated BMI 31    Physical Exam completed by visual and auditory observation of patient with verbal input from patient.    General:   Alert, oriented, not in acute distress   Eyes:  Anicteric Sclerae; no obvious strabismus   Nose:  No obvious nasal septum deviation    Neck:   Midline trachea, no visible mass   Chest/Lungs:  Respiratory effort normal, no visualized signs of difficulty breathing or respiratory distress   CVS:  No JVD   Extremities:  No obvious rashes noted on face, neck, or hands   Neuro:  No facial asymmetry, no focal deficits; no obvious tremor    Psych:  Normal affect,  normal countenance     [name] is being evaluated by a Virtual Visit (video visit) encounter to address concerns as mentioned above.  A caregiver was present when appropriate. 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  Pursuant to the emergency declaration under the [facility] Act and the [employer], 1135 waiver authority and the [employer] Act, this Virtual Visit was conducted with patient's (and/or legal guardian's) consent, to reduce the patient's risk of  exposure to COVID-19 and provide necessary medical care. The patient (or guardian if applicable) is aware that this is a billable service, which includes applicable copays.     Patient identification  was verified at the start of the visit: Yes using name and date of birth. Patient's phone number [phone] (home) [phone] (work) was confirmed for accuracy.  He gives permission for messages regarding results and appointments to be left at that number.    Services were provided through a video synchronous discussion virtually to substitute for in-person clinic visit.  I was  at home  while conducting this encounter, patient located at their home or alternate location of their choice.    Travis Weaver, was evaluated through a synchronous (real-time) audio-video encounter. The patient (or guardian if applicable) is aware that this is a billable service, which includes applicable co-pays. This Virtual Visit was conducted with patient's (and/or legal guardian's) consent. Patient identification was verified, and a caregiver was present when appropriate.   The patient was located at Other: work, Environmental health practitioner was located at [employer]Appt Dept State): [place]  Confirm you are appropriately licensed, registered, or certified to deliver care in the state where the patient is located as indicated above. If you are not or unsure, please re-schedule the visit: Yes, I confirm.       --Levonne Rear, APRN - NP on 05/29/2023 at 3:01 PM    An electronic signature was used to authenticate this note.

## 2023-06-02 ENCOUNTER — Encounter

## 2023-06-07 MED ORDER — GABAPENTIN 300 MG PO CAPS
300 | ORAL_CAPSULE | Freq: Two times a day (BID) | ORAL | 0 refills | 30.00000 days | Status: DC
Start: 2023-06-07 — End: 2023-09-10

## 2023-06-10 ENCOUNTER — Encounter

## 2023-06-10 MED ORDER — ZEPBOUND 12.5 MG/0.5ML SC SOAJ
12.5 | SUBCUTANEOUS | 0 refills | 28.00000 days | Status: DC
Start: 2023-06-10 — End: 2023-07-08

## 2023-07-07 ENCOUNTER — Encounter

## 2023-07-08 ENCOUNTER — Encounter

## 2023-07-08 MED ORDER — LINZESS 290 MCG PO CAPS
290 | ORAL_CAPSULE | Freq: Every day | ORAL | 1 refills | 30.00000 days | Status: DC
Start: 2023-07-08 — End: 2024-01-10

## 2023-07-08 MED ORDER — TIRZEPATIDE-WEIGHT MANAGEMENT 15 MG/0.5ML SC SOAJ
15 | SUBCUTANEOUS | 0 refills | 28.00000 days | Status: AC
Start: 2023-07-08 — End: ?

## 2023-07-26 MED ORDER — SUCRALFATE 1 G PO TABS
1 | ORAL_TABLET | Freq: Every evening | ORAL | 1 refills | 30.00000 days | Status: AC
Start: 2023-07-26 — End: ?

## 2023-07-26 NOTE — Telephone Encounter (Signed)
 Pharmacy fax requesting change in medication Wegovy  0.25 MG/ 0.5 ML

## 2023-07-26 NOTE — Telephone Encounter (Signed)
 Patient has used wegovy  previous and does not work well for him.  He would prefer we complete a prior authorization asking for coverage on July 1st when insurance changes formulary.         PA will need to be submitted on July 1st or later.

## 2023-07-31 NOTE — Telephone Encounter (Signed)
 Called patient and left message to call with new insurance info for meds

## 2023-07-31 NOTE — Telephone Encounter (Signed)
 Spoke with patient he stated they want him to switch because its cheaper he wants to stay on zepbound  and for it to be left alone. Patient wanted me to relay this to his provider.

## 2023-07-31 NOTE — Telephone Encounter (Signed)
 Yes, I am aware that his insurance changed formulary.     The ask was to complete a new PA for zepbound  after July 1st as he has used this with improvement in weight loss.  Try wegovy  in the past and was not successful (all in my documentation in previous office notes if needs to turn into insurance).

## 2023-08-04 ENCOUNTER — Encounter

## 2023-08-05 ENCOUNTER — Encounter

## 2023-08-05 MED ORDER — PANTOPRAZOLE SODIUM 40 MG PO TBEC
40 | ORAL_TABLET | Freq: Every day | ORAL | 1 refills | 30.00000 days | Status: DC
Start: 2023-08-05 — End: 2024-01-27

## 2023-08-05 NOTE — Telephone Encounter (Signed)
 Message to pt in ref to Insurance. Need to have new information.

## 2023-08-06 ENCOUNTER — Encounter

## 2023-08-06 MED ORDER — TIRZEPATIDE 15 MG/0.5ML SC SOAJ
15 | SUBCUTANEOUS | 0 refills | Status: AC
Start: 2023-08-06 — End: ?

## 2023-09-05 ENCOUNTER — Encounter

## 2023-09-10 MED ORDER — GABAPENTIN 300 MG PO CAPS
300 | ORAL_CAPSULE | Freq: Two times a day (BID) | ORAL | 0 refills | 30.00000 days | Status: DC
Start: 2023-09-10 — End: 2023-12-12

## 2023-09-19 ENCOUNTER — Ambulatory Visit: Admit: 2023-09-19 | Discharge: 2023-09-19 | Payer: BLUE CROSS/BLUE SHIELD | Attending: Family | Primary: Family

## 2023-09-19 MED ORDER — SEMAGLUTIDE-WEIGHT MANAGEMENT 1 MG/0.5ML SC SOAJ
1 | SUBCUTANEOUS | 0 refills | 28.00000 days | Status: DC
Start: 2023-09-19 — End: 2023-10-18

## 2023-09-19 NOTE — Progress Notes (Signed)
 Chief Complaint   Patient presents with    Weight Loss     BP 122/80 (BP Site: Right Upper Arm, Patient Position: Sitting)   Pulse 73   Temp 98.1 F (36.7 C) (Oral)   Resp 16   Ht 1.702 m (5' 7)   Wt 95.3 kg (210 lb)   SpO2 94%   BMI 32.89 kg/m     Have you been to the ER, urgent care clinic since your last visit?  Hospitalized since your last visit?   NO    Have you seen or consulted any other health care providers outside our system since your last visit?   NO

## 2023-09-19 NOTE — Progress Notes (Signed)
 Travis Weaver is a 58 y.o. male who was seen in clinic today (09/19/2023).    Assessment & Plan:   Below is the assessment and plan developed based on review of pertinent history, physical exam, labs, studies, and medications.    1. Chronic fatigue  Comments:  will evaluate labs.  Orders:  -     Testosterone, free, total  -     C-Reactive Protein  -     Sedimentation Rate  -     ANA, Direct, w/Reflex  2. Class 2 severe obesity due to excess calories with serious comorbidity and body mass index (BMI) of 35.0 to 35.9 in adult [facility])  Comments:  Will start on wegovy  for weight loss.  Orders:  -     Semaglutide -Weight Management (WEGOVY ) 1 MG/0.5ML SOAJ SC injection; Inject 1 mg into the skin every 7 days, Disp-2 mL, R-0Normal  3. Encounter for dietary counseling and surveillance  Comments:  continue higher protein (100 g) and high fiber in the diet.   Patient should stick with more whole foods.  4. Encounter for exercise counseling  Comments:  encouraged to increase exercise to aid in weight loss and to help with energy level/more restful sleep.      No follow-ups on file.    Subjective:   Travis Weaver was seen today for Weight Loss     Obesity:  Patient reported that was doing well on zepbound  for weight loss but then his insurance formulary changed and ask that he swtich to wegovy  which he previously used and tolerated but without as much success.     Nutritional or dietetic assessment   Current dietary plan: working on eating healthier, higher protein.    Trying to limit snacks  Exercise:had carpal tunnel surgery x1 month agom, weight lifting has been limited.   Length of time dietary changes have been made: Greater than 6 months  Medication Management: previously used zepbound       Last Weight Metrics:      09/19/2023     7:02 AM 04/10/2023     7:07 AM 02/26/2023     3:49 PM 01/01/2023     3:59 PM 10/30/2022    10:43 AM 08/13/2022     9:03 AM 07/18/2022    12:52 PM   Weight Loss Metrics   Height 5' 7 5' 7 5' 7 5' 7 5' 7 5' 7  5' 7   Weight - Scale 210 lbs 198 lbs  222 lbs 10 oz 227 lbs 225 lbs 215 lbs   BMI (Calculated) 33 kg/m2 31.1 kg/m2   34.9 kg/m2  35.6 kg/m2  35.3 kg/m2  33.7 kg/m2        Data saved with a previous flowsheet row definition        Does the patient have a history or current eating disorder: No  Does the patient have hx of malabsorption syndromes, cholestasis, pregnancy, and/or lactation?: No  Is the patient currently using a GLP1 like Victoza or Ozempic : No      Chronic fatigue:  Patient reported he has been having chronic fatigue for years.  Patient notes that heh has been treated for sleep apnea consistently for 6 months with no improvement.  Notes he previously had low testosterone and was treated with supplemental testosterone but did not see much improvement in his energy levels.  Notes he wakes up fatigued every morning.        Review of Systems   Constitutional:  Negative for fatigue.  Eyes:  Negative for visual disturbance.   Respiratory:  Negative for shortness of breath.    Cardiovascular:  Negative for chest pain, palpitations and leg swelling.   Neurological:  Negative for dizziness, weakness and headaches.   Psychiatric/Behavioral:  Negative for sleep disturbance. The patient is not nervous/anxious.           Objective:     Vitals:    09/19/23 0702   BP: 122/80   BP Site: Right Upper Arm   Patient Position: Sitting   Pulse: 73   Resp: 16   Temp: 98.1 F (36.7 C)   TempSrc: Oral   SpO2: 94%   Weight: 95.3 kg (210 lb)   Height: 1.702 m (5' 7)      Body mass index is 32.89 kg/m.     Physical Exam  Constitutional:       Appearance: Normal appearance.   HENT:      Head: Normocephalic.   Eyes:      Conjunctiva/sclera: Conjunctivae normal.   Cardiovascular:      Rate and Rhythm: Normal rate and regular rhythm.   Pulmonary:      Effort: Pulmonary effort is normal.      Breath sounds: Normal breath sounds.   Musculoskeletal:      Cervical back: Normal range of motion.   Skin:     General: Skin is warm and  dry.   Neurological:      Mental Status: He is alert and oriented to person, place, and time.   Psychiatric:         Mood and Affect: Mood normal.         Behavior: Behavior normal.          Allergies   Allergen Reactions    Latex Hives       Current Outpatient Medications   Medication Sig Dispense Refill    Semaglutide -Weight Management (WEGOVY ) 1 MG/0.5ML SOAJ SC injection Inject 1 mg into the skin every 7 days 2 mL 0    gabapentin  (NEURONTIN ) 300 MG capsule Take 1 capsule by mouth 2 times daily for 180 doses. Intended supply: 90 days Max Daily Amount: 600 mg 180 capsule 0    pantoprazole  (PROTONIX ) 40 MG tablet TAKE 1 TABLET BY MOUTH EVERY DAY BEFORE BREAKFAST 90 tablet 1    sucralfate  (CARAFATE ) 1 GM tablet TAKE 1 TABLET BY MOUTH EVERY DAY IN THE EVENING 90 tablet 1    LINZESS  290 MCG CAPS capsule TAKE 1 CAPSULE BY MOUTH EVERY DAY 90 capsule 1    Venlafaxine Besylate ER 112.5 MG TB24 Take 1 tablet by mouth daily      brexpiprazole  (REXULTI ) 1 MG TABS tablet Take 1 tablet by mouth daily 30 tablet 1    dilTIAZem  (CARDIZEM  CD) 240 MG extended release capsule Take 1 capsule by mouth daily 90 capsule 3    hydrOXYzine  HCl (ATARAX ) 25 MG tablet TAKE 1 TABLET BY MOUTH EVERY 8 HOURS AS NEEDED FOR ANXIETY 270 tablet 1    clobetasol (TEMOVATE) 0.05 % external solution PLEASE SEE ATTACHED FOR DETAILED DIRECTIONS      ketoconazole (NIZORAL) 2 % cream APPLY ONCE A DAY BETWEEN BROWS AND SIDES OF NOSE.      Calcium Citrate-Vitamin D 250-5 MG-MCG TABS Take by mouth      Ascorbic Acid (VITAMIN C) 500 MG CAPS Take by mouth      clindamycin (CLEOCIN T) 1 % external solution APPLY TO THE BACK OF SCALP EVERY MORNING.  Magnesium 400 MG TABS Take by mouth      traZODone (DESYREL) 150 MG tablet  (Patient not taking: Reported on 09/19/2023)       No current facility-administered medications for this visit.       Reviewed and updated this visit:  Tobacco  Allergies  Meds  Problems  Med Hx  Surg Hx  Soc Hx  Fam Hx     Gerri LITTIE Chang, APRN - NP

## 2023-09-24 LAB — TESTOSTERONE, FREE, TOTAL
Testosterone, Free: 3.6 pg/mL — ABNORMAL LOW (ref 6.6–18.1)
Testosterone: 303 ng/dL (ref 264–916)

## 2023-10-17 ENCOUNTER — Encounter

## 2023-10-18 MED ORDER — SEMAGLUTIDE-WEIGHT MANAGEMENT 1.7 MG/0.75ML SC SOAJ
1.7 | SUBCUTANEOUS | 0 refills | 28.00000 days | Status: DC
Start: 2023-10-18 — End: 2023-11-18

## 2023-11-05 ENCOUNTER — Encounter

## 2023-11-05 MED ORDER — DILTIAZEM HCL ER COATED BEADS 240 MG PO CP24
240 | ORAL_CAPSULE | Freq: Every day | ORAL | 3 refills | Status: AC
Start: 2023-11-05 — End: ?

## 2023-11-17 ENCOUNTER — Encounter

## 2023-11-18 MED ORDER — SEMAGLUTIDE-WEIGHT MANAGEMENT 2.4 MG/0.75ML SC SOAJ
2.4 | SUBCUTANEOUS | 5 refills | Status: AC
Start: 2023-11-18 — End: ?

## 2023-12-10 ENCOUNTER — Encounter

## 2023-12-12 ENCOUNTER — Encounter

## 2023-12-12 MED ORDER — GABAPENTIN 100 MG PO CAPS
100 | ORAL_CAPSULE | ORAL | 0 refills | 30.00000 days | Status: AC
Start: 2023-12-12 — End: 2024-01-02

## 2023-12-12 MED ORDER — GABAPENTIN 300 MG PO CAPS
300 | ORAL_CAPSULE | Freq: Two times a day (BID) | ORAL | 0 refills | 30.00000 days | Status: DC
Start: 2023-12-12 — End: 2023-12-12

## 2023-12-24 ENCOUNTER — Encounter: Payer: BLUE CROSS/BLUE SHIELD | Attending: Otolaryngology | Primary: Family

## 2024-01-10 ENCOUNTER — Encounter

## 2024-01-10 MED ORDER — LINZESS 290 MCG PO CAPS
290 | ORAL_CAPSULE | Freq: Every day | ORAL | 1 refills | 30.00000 days | Status: AC
Start: 2024-01-10 — End: ?

## 2024-01-27 MED ORDER — PANTOPRAZOLE SODIUM 40 MG PO TBEC
40 | ORAL_TABLET | Freq: Every day | ORAL | 1 refills | 30.00000 days | Status: AC
Start: 2024-01-27 — End: ?

## 2024-02-04 MED ORDER — MELOXICAM 15 MG PO TABS
15 | ORAL_TABLET | Freq: Every day | ORAL | 1 refills | 90.00000 days | Status: AC
Start: 2024-02-04 — End: ?
